# Patient Record
Sex: Male | Born: 1960 | Hispanic: Yes | Marital: Married | Smoking: Never smoker
Health system: Northeastern US, Community
[De-identification: ages and names within clinical notes are randomized; demographics above are authoritative.]

## PROBLEM LIST (undated history)

## (undated) DIAGNOSIS — H5210 Myopia, unspecified eye: Secondary | ICD-10-CM

## (undated) DIAGNOSIS — H524 Presbyopia: Secondary | ICD-10-CM

## (undated) DIAGNOSIS — T300 Burn of unspecified body region, unspecified degree: Secondary | ICD-10-CM

## (undated) DIAGNOSIS — M545 Low back pain: Principal | ICD-10-CM

## (undated) DIAGNOSIS — G8929 Other chronic pain: Principal | ICD-10-CM

## (undated) HISTORY — DX: Other chronic pain: G89.29

## (undated) HISTORY — PX: NO SIGNIFICANT SURGICAL HISTORY: 1000005

## (undated) HISTORY — DX: Low back pain: M54.5

## (undated) HISTORY — DX: Burn of unspecified body region, unspecified degree: T30.0

## (undated) HISTORY — DX: Myopia, unspecified eye: H52.10

## (undated) HISTORY — DX: Presbyopia: H52.4

---
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
## 2002-06-12 ENCOUNTER — Encounter (HOSPITAL_BASED_OUTPATIENT_CLINIC_OR_DEPARTMENT_OTHER): Payer: Self-pay

## 2003-05-15 ENCOUNTER — Encounter (HOSPITAL_BASED_OUTPATIENT_CLINIC_OR_DEPARTMENT_OTHER): Payer: Self-pay

## 2003-11-02 DIAGNOSIS — T300 Burn of unspecified body region, unspecified degree: Secondary | ICD-10-CM

## 2003-11-02 HISTORY — DX: Burn of unspecified body region, unspecified degree: T30.0

## 2004-04-01 ENCOUNTER — Ambulatory Visit (HOSPITAL_BASED_OUTPATIENT_CLINIC_OR_DEPARTMENT_OTHER): Payer: Self-pay | Admitting: Internal Medicine

## 2004-04-02 ENCOUNTER — Encounter: Payer: Self-pay | Admitting: Internal Medicine

## 2004-04-03 ENCOUNTER — Ambulatory Visit: Payer: Self-pay | Admitting: Family Medicine

## 2004-04-03 LAB — CHG LIPID PANEL
Cholesterol: 211 mg/dl — ABNORMAL HIGH (ref 0–200)
HIGH DENSITY LIPOPROTEIN: 68 mg/dl (ref 35–95)
LOW DENSITY LIPOPROTEIN DIRECT: 131 mg/dl — ABNORMAL HIGH (ref ?–130)
RISK FACTOR: 3.1 (ref ?–5.0)
TRIGLYCERIDES: 73 mg/dl (ref 30–200)

## 2004-04-03 LAB — BASIC METABOLIC PANEL
BUN (UREA NITROGEN): 20 mg/dl (ref 10–20)
CALCIUM: 9.6 mg/dl (ref 8.5–10.5)
CARBON DIOXIDE: 28 mEQ/L (ref 22–32)
CHLORIDE: 106 mEQ/L (ref 98–110)
CREATININE: 1.1 mg/dl (ref 0.8–1.4)
Glucose Random: 80 mg/dl (ref 65–160)
POTASSIUM: 5.3 mEQ/L — ABNORMAL HIGH (ref 3.5–5.0)
SODIUM: 137 mEQ/L (ref 135–145)

## 2007-11-01 ENCOUNTER — Ambulatory Visit (HOSPITAL_BASED_OUTPATIENT_CLINIC_OR_DEPARTMENT_OTHER): Payer: Self-pay

## 2007-11-21 ENCOUNTER — Encounter (HOSPITAL_BASED_OUTPATIENT_CLINIC_OR_DEPARTMENT_OTHER): Payer: Self-pay

## 2007-11-21 ENCOUNTER — Ambulatory Visit (HOSPITAL_BASED_OUTPATIENT_CLINIC_OR_DEPARTMENT_OTHER): Payer: PRIVATE HEALTH INSURANCE

## 2007-11-21 DIAGNOSIS — Z1388 Encounter for screening for disorder due to exposure to contaminants: Secondary | ICD-10-CM

## 2007-11-21 DIAGNOSIS — Z0000 Encounter for general adult medical examination without abnormal findings: Secondary | ICD-10-CM
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
## 2007-11-22 ENCOUNTER — Encounter (HOSPITAL_BASED_OUTPATIENT_CLINIC_OR_DEPARTMENT_OTHER): Payer: Self-pay

## 2007-11-22 ENCOUNTER — Ambulatory Visit (HOSPITAL_BASED_OUTPATIENT_CLINIC_OR_DEPARTMENT_OTHER): Payer: PRIVATE HEALTH INSURANCE

## 2007-11-22 DIAGNOSIS — Z0000 Encounter for general adult medical examination without abnormal findings: Secondary | ICD-10-CM

## 2007-11-22 DIAGNOSIS — Z1388 Encounter for screening for disorder due to exposure to contaminants: Secondary | ICD-10-CM

## 2007-11-22 LAB — BLOOD COUNT COMPLETE AUTO&AUTO DIFRNTL WBC
BASOPHIL %: 0.2 % (ref 0.0–2.0)
EOSINOPHIL %: 2.9 % (ref 0.0–7.0)
HEMATOCRIT: 41.9 % — ABNORMAL LOW (ref 42.0–54.0)
HEMOGLOBIN: 13.4 g/dl — ABNORMAL LOW (ref 14.0–18.0)
LYMPHOCYTE %: 39.9 % — ABNORMAL HIGH (ref 13.0–39.0)
MEAN CORP HGB CONC: 32 g/dl (ref 32.0–36.0)
MEAN CORPUSCULAR HGB: 25.9 pg — ABNORMAL LOW (ref 27.0–33.0)
MEAN CORPUSCULAR VOL: 80.9 fl (ref 80.0–100.0)
MEAN PLATELET VOLUME: 8.2 fl (ref 6.4–10.8)
MONOCYTE %: 5.6 % (ref 1.0–12.0)
NEUTROPHIL %: 51.4 % (ref 46.0–79.0)
PLATELET COUNT: 231 10*3/uL (ref 150–400)
RBC DISTRIBUTION WIDTH: 14.2 % (ref 11.5–14.3)
RED BLOOD CELL COUNT: 5.18 M/uL (ref 4.50–6.10)
WHITE BLOOD CELL COUNT: 4.8 10*3/uL (ref 4.0–10.8)

## 2007-11-22 NOTE — Progress Notes (Signed)
Drew 3 sst and 3 lavander tops.

## 2007-11-24 LAB — CHG LIPID PANEL
Cholesterol: 204 mg/dl — ABNORMAL HIGH (ref 0–200)
HIGH DENSITY LIPOPROTEIN: 62 mg/dl (ref 29–71)
LOW DENSITY LIPOPROTEIN DIRECT: 120 mg/dl — ABNORMAL HIGH (ref 0–100)
RISK FACTOR: 3.3 (ref ?–5.0)
TRIGLYCERIDES: 88 mg/dl (ref 0–150)

## 2007-11-24 LAB — COMPREHENSIVE METABOLIC PANEL
ALANINE AMINOTRANSFERASE: 25 IU/L (ref 10–40)
ALBUMIN: 4.1 g/dl (ref 3.4–4.8)
ALKALINE PHOSPHATASE: 49 IU/L (ref 25–106)
ANION GAP: 11 mmol/L (ref 2–25)
ASPARTATE AMINOTRANSFERASE: 27 IU/L (ref 8–34)
BILIRUBIN TOTAL: 0.7 mg/dl (ref 0.2–1.1)
BUN (UREA NITROGEN): 16 mg/dl (ref 6–20)
CALCIUM: 9.4 mg/dl (ref 8.6–10.0)
CARBON DIOXIDE: 27 mmol/L (ref 22–32)
CHLORIDE: 101 mmol/L (ref 101–111)
CREATININE: 1.1 mg/dl (ref 0.7–1.2)
ESTIMATED GLOMERULAR FILT RATE: >60 mL/min (ref 60–116)
Glucose Random: 88 mg/dl (ref 74–160)
POTASSIUM: 4.3 mmol/L (ref 3.5–5.1)
SODIUM: 139 mmol/L (ref 135–144)
TOTAL PROTEIN: 7.1 g/dl (ref 5.9–7.5)

## 2007-11-24 LAB — PROSTATIC SPECIFIC ANTIGEN: PROSTATIC SPECIFIC ANTIGEN: 0.6 ng/mL (ref 0.0–4.0)

## 2007-11-24 LAB — PROTOPORPHYRIN, FEP/ZPP
FREE ERYTHROCYTE PROTOPORPHYRI: 28 ug/dL (ref 0–34)
ZINC PROTOPORPHYRIN: 31 ug/dL (ref 0–38)

## 2007-11-24 LAB — LEAD VENOUS: LEAD VENOUS: 2 ug/dl (ref 0–15)

## 2007-11-24 LAB — HIV 1 AND 2 PLUS O ANTIBODY: HIV 1 AND 2 PLUS O SCREEN: NONREACTIVE

## 2007-11-28 ENCOUNTER — Encounter (HOSPITAL_BASED_OUTPATIENT_CLINIC_OR_DEPARTMENT_OTHER): Payer: Self-pay

## 2007-12-07 ENCOUNTER — Ambulatory Visit (HOSPITAL_BASED_OUTPATIENT_CLINIC_OR_DEPARTMENT_OTHER): Payer: PRIVATE HEALTH INSURANCE

## 2007-12-07 ENCOUNTER — Encounter (HOSPITAL_BASED_OUTPATIENT_CLINIC_OR_DEPARTMENT_OTHER): Payer: Self-pay

## 2007-12-07 VITALS — BP 122/68 | HR 65 | Temp 97.4°F | Ht 69.68 in | Wt 146.0 lb

## 2007-12-07 DIAGNOSIS — Z0000 Encounter for general adult medical examination without abnormal findings: Secondary | ICD-10-CM

## 2007-12-07 DIAGNOSIS — M25569 Pain in unspecified knee: Secondary | ICD-10-CM
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
## 2007-12-29 ENCOUNTER — Ambulatory Visit (HOSPITAL_BASED_OUTPATIENT_CLINIC_OR_DEPARTMENT_OTHER): Payer: PRIVATE HEALTH INSURANCE | Admitting: Orthopaedic Surgery

## 2007-12-29 DIAGNOSIS — S83419A Sprain of medial collateral ligament of unspecified knee, initial encounter: Secondary | ICD-10-CM

## 2007-12-29 LAB — XR KNEES STANDING AP ONLY BILATERAL

## 2007-12-29 LAB — KNEE, LEFT 4 VIEWS

## 2008-01-01 LAB — ORTHOPEDIC OFFICE NOTE

## 2008-01-18 ENCOUNTER — Ambulatory Visit (HOSPITAL_BASED_OUTPATIENT_CLINIC_OR_DEPARTMENT_OTHER): Payer: PRIVATE HEALTH INSURANCE | Admitting: Rehabilitative and Restorative Service Providers"

## 2008-01-18 DIAGNOSIS — S83419A Sprain of medial collateral ligament of unspecified knee, initial encounter: Secondary | ICD-10-CM

## 2008-01-23 ENCOUNTER — Ambulatory Visit (HOSPITAL_BASED_OUTPATIENT_CLINIC_OR_DEPARTMENT_OTHER): Payer: Self-pay | Admitting: Rehabilitative and Restorative Service Providers"

## 2008-01-24 ENCOUNTER — Ambulatory Visit (HOSPITAL_BASED_OUTPATIENT_CLINIC_OR_DEPARTMENT_OTHER): Payer: Self-pay

## 2008-02-23 ENCOUNTER — Ambulatory Visit (HOSPITAL_BASED_OUTPATIENT_CLINIC_OR_DEPARTMENT_OTHER): Payer: PRIVATE HEALTH INSURANCE | Admitting: Surgical

## 2009-03-17 ENCOUNTER — Encounter (HOSPITAL_BASED_OUTPATIENT_CLINIC_OR_DEPARTMENT_OTHER): Payer: Self-pay

## 2009-03-17 ENCOUNTER — Emergency Department (HOSPITAL_BASED_OUTPATIENT_CLINIC_OR_DEPARTMENT_OTHER)
Admission: RE | Admit: 2009-03-17 | Disposition: A | Discharge status: Home / Self Care | Payer: Self-pay | Source: Emergency Department | Attending: Emergency Medicine | Admitting: Emergency Medicine

## 2009-03-17 MED ORDER — MOTRIN 800 MG PO TABS
ORAL_TABLET | ORAL | Status: AC
Start: 2009-03-17 — End: 2009-04-16

## 2009-03-17 NOTE — ED Notes (Signed)
Lt groin pain, sport relateted, sudden onset after playing soccer last night, no relief from APAP & ice, difficult to ambulate per pt

## 2009-03-17 NOTE — Discharge Instructions (Signed)
You have strained a muscle in your groin.  It is important to rest, apply icepacks several times a day and take Motrin for pain.    Stay out of work for 3 days to rest and heal.

## 2009-03-21 LAB — EMERGENCY ROOM NOTE

## 2009-12-11 ENCOUNTER — Ambulatory Visit (HOSPITAL_BASED_OUTPATIENT_CLINIC_OR_DEPARTMENT_OTHER): Payer: Self-pay | Admitting: Internal Medicine

## 2010-10-20 ENCOUNTER — Ambulatory Visit (HOSPITAL_BASED_OUTPATIENT_CLINIC_OR_DEPARTMENT_OTHER): Payer: Self-pay | Admitting: Occupational Medicine

## 2010-10-22 ENCOUNTER — Ambulatory Visit (HOSPITAL_BASED_OUTPATIENT_CLINIC_OR_DEPARTMENT_OTHER): Payer: Self-pay

## 2010-10-22 LAB — TREPONEMA PALLIDUM AB IGG: TREPONEMA PALLIDUM AB IgG: NONREACTIVE

## 2011-03-01 ENCOUNTER — Emergency Department (HOSPITAL_BASED_OUTPATIENT_CLINIC_OR_DEPARTMENT_OTHER)
Admission: RE | Admit: 2011-03-01 | Disposition: A | Discharge status: Home / Self Care | Payer: Self-pay | Source: Emergency Department | Attending: Emergency Medicine | Admitting: Emergency Medicine

## 2011-03-01 ENCOUNTER — Encounter (HOSPITAL_BASED_OUTPATIENT_CLINIC_OR_DEPARTMENT_OTHER): Payer: Self-pay

## 2011-03-01 MED ORDER — IBUPROFEN 800 MG PO TABS
800.0000 mg | ORAL_TABLET | Freq: Three times a day (TID) | ORAL | Status: DC | PRN
Start: 2011-03-01 — End: 2011-04-12

## 2011-03-01 NOTE — ED Triage Note (Signed)
PT INJURED LEFT PINKY FINGER LAST WEEK PLAYING SOCCER.  PT COMPLAIN 5/10 PAIN.  +CSM.

## 2011-03-01 NOTE — Discharge Instructions (Signed)
Entorse  (Sprains)     O seu médico diagnosticou uma entorse. A entorse é uma lesão dolorida de uma junta como resultado de ruptura parcial ou completa de ligamentos.     INSTRUÇÕES PARA TRATAMENTO DOMICILIAR  Ø Nas primeiras 24 horas, mantenha a extremidade machucada elevada sobre 2 travesseiros enquanto estiver deitado.  Ø Aplique compressa de gelo (gelo num saco plástico enrolado numa toalha para evitar ulceração da pele) sobre a área machucada a cada 2 horas por 20 a 30 minutos, enquanto estiver acordado pelas primeiras 24 horas e, a seguir, conforme orientado pelo médico.  Ø Só tome no balcao ou medicinas de receita para dor, incômodo, ou febre como dirigido por seu médico.  Ø Se lhe foi aplicada hoje uma bandagem de pressão, ela deve ser removida (afrouxada) e aplicada novamente a cada 3 ou 4 horas. Não deve ser mantida muito apertada, mas suficientemente firme para diminuir o inchaço. Observe se os dedos das mãos e dos pés se estão inchados, com descoloração azulada, frios, com formigamento ou dor excessiva. Se qualquer desses sintomas ocorrer, remova a bandagem e reaplique frouxamente. Se estes sintomas persistirem, entre em contato com seu médico ou retorne a este local.     Dor persistente e incapacidade de usar a área machucada por mais de 2 ou 3 dias são sinais de advertência que indicam que um médico deve ser procurado tão logo possível para o prosseguimento do tratamento. No princípio, uma fratura da espessura de um fio de cabelo (é o mesmo que um osso quebrado) pode não ficar aparente nos raios X. Dor persistente e inchaço indicam a necessidade de uma avaliação mais profunda, não se submeter a nenhum tipo de peso (uso de muletas, conforme indicação) e/ou mais raios X são indicados. Os raios X, às vezes, podem não mostrar uma fratura pequena até uma semana ou dez dias mais tarde. Marque uma consulta para acompanhamento com o seu próprio médico ou com um daqueles a quem nós indicamos. Um radiologista  reexaminará as seus raios X. Certifique-se de como obter os resultados de seus raios X. Não presuma que tudo está normal se você não entrarmos em contato.     PROCURE UM MÉDICO IMEDIATAMENTE SE:  Ø Apresentar forte dor ou mais inchaço.  Ø A dor não for controlada com medicamentos.  Ø A pele ou unha sob a lesão ficar roxa, acinzentada, gelada ou dormente.     Document Released: 10/18/2005  Document Re-Released: 08/20/2008  Exit[employer]® Patient Information ©2010 [employer].

## 2011-03-01 NOTE — ED Notes (Signed)
FINGER SPLINT APPLIED BY PAR II.  DISCHARGE INSTRUCTIONS PROVIDED TO PT.  PT VERBALIZED UNDERSTANDING WITHOUT QUESTION.  PT AMBULATED OUT OF ED.
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
## 2011-03-02 NOTE — ED Provider Notes (Signed)
Attempted to call patient re: xray results, no answer, left message.

## 2011-03-03 NOTE — ED Provider Notes (Signed)
Spoke with pt  & informed of XR findings suggestive of possible tendon injury, encouraged to wear splint & call orthopedics for f/u appt.

## 2011-03-05 ENCOUNTER — Ambulatory Visit (HOSPITAL_BASED_OUTPATIENT_CLINIC_OR_DEPARTMENT_OTHER): Payer: No Typology Code available for payment source

## 2011-03-05 DIAGNOSIS — M20019 Mallet finger of unspecified finger(s): Secondary | ICD-10-CM

## 2011-03-05 NOTE — Progress Notes (Signed)
This office note has been dictated. Account number [id number]

## 2011-03-10 NOTE — Progress Notes (Signed)
Date of Service: 03/05/2011    DATE OF SERVICE:  Mar 05, 2011    DIAGNOSIS:  Left small finger mallet finger.    HISTORY OF PRESENT ILLNESS:  This 50 year old right-handed male patient explains he was playing soccer 9 days ago and the ball hit his left finger.  He jammed the finger and immediately developed pain and flexion deformity.  He was seen and evaluated in the Emergency Department for this injury.  X-rays were performed.  These were negative for any fracture.  They placed him in a volar finger splint and asked him to follow up with orthopedics.  He had only minimal complaints of pain.  No numbness or tingling and no skin issues.    PAST MEDICAL HISTORY:  Negative for chronic illnesses.    MEDICATIONS:  Ibuprofen 800 mg.    ALLERGIES:  No known drug allergies.    SOCIAL HISTORY:  The patient is a nonsmoker, denies alcohol usage.  His primary language is [place], but he does speak [place].  An interpreter is not required.  He works in Holiday representative.    REVIEW OF SYSTEMS:  Significant for a left finger injury with pain associated at the DIP joint and a flexion deformity.  All other systems reviewed and found to negative for symptoms.    FAMILY HISTORY:  Noncontributory.    PAST SURGICAL HISTORY:  Negative.    PHYSICAL EXAMINATION:  GENERAL:  Reveals a very pleasant 50 year old male patient in no acute distress.  MUSCULOSKELETAL:  The left upper extremity reveals the small finger to have a mallet finger deformity.  He has about 45-degree extension lag.  I am able to passively extend the finger to 0 station.  The skin is intact without lacerations, abrasions or lesions.  Gross sensation is intact to the fingertip.  The other digits are in individually examined and found to be negative for any injury and 2+ radial pulses noted.    DIAGNOSTIC DATA:  X-rays of the left small finger were reviewed again today.  These reveal no evidence of bony association with the mallet deformity.    ASSESSMENT AND PLAN:  A  50 year old male patient with a small finger mallet finger on the left.  His diagnosis was explained to him today.  I spent time counseling and educating him on how to treat this.  It very important that he splints full time.  We will see him back in 2 weeks to check his skin and make sure he is splinting appropriately.  Two splints were given today and instructions were given on how to change these.  He will require assistance.  He will see [place] back for followup in 2 weeks.    ___________________________  Reviewed and Electronically Signed By: Juanetta Gosling PA-C  Sig Date: 03/10/2011  Sig Time: 09:32:20  Dictated By: Juanetta Gosling PA-C  Dict Date: 03/05/2011 Dict Time: 12 07 PM    Dictation Date and Time:03/05/2011 12:07:37  Transcription Date and Time:03/05/2011 12:47:12  eScription Dictation id: [id number] Confirmation # :[id number]    DICTATED BY: Juanetta Gosling PA-CKATRINA D THOMPSON PA-CD:03/05/2011 12:07:37 T:03/05/2011 12:47:12 79F Job#: [id number]

## 2011-03-11 ENCOUNTER — Telehealth (HOSPITAL_BASED_OUTPATIENT_CLINIC_OR_DEPARTMENT_OTHER): Payer: Self-pay | Admitting: Internal Medicine

## 2011-03-11 NOTE — Telephone Encounter (Signed)
Left message for patient to call [facility] and book an appointment with Dr. Greer Ee as a New PT.

## 2011-03-17 NOTE — Telephone Encounter (Addendum)
Called patient again. Patient booked an appointment with Dr. Noel Gerold on 04/12/11

## 2011-03-18 ENCOUNTER — Ambulatory Visit (HOSPITAL_BASED_OUTPATIENT_CLINIC_OR_DEPARTMENT_OTHER): Payer: No Typology Code available for payment source | Admitting: Hand Surgery

## 2011-03-22 ENCOUNTER — Ambulatory Visit (HOSPITAL_BASED_OUTPATIENT_CLINIC_OR_DEPARTMENT_OTHER): Payer: No Typology Code available for payment source | Admitting: Hand Surgery

## 2011-03-22 DIAGNOSIS — M20019 Mallet finger of unspecified finger(s): Secondary | ICD-10-CM

## 2011-03-22 NOTE — Progress Notes (Signed)
This office note has been dictated. Account number [id number]

## 2011-04-12 ENCOUNTER — Ambulatory Visit (HOSPITAL_BASED_OUTPATIENT_CLINIC_OR_DEPARTMENT_OTHER): Payer: No Typology Code available for payment source | Admitting: Internal Medicine

## 2011-04-12 ENCOUNTER — Encounter (HOSPITAL_BASED_OUTPATIENT_CLINIC_OR_DEPARTMENT_OTHER): Payer: Self-pay | Admitting: Internal Medicine

## 2011-04-12 VITALS — BP 128/70 | HR 66 | Temp 98.1°F | Ht 69.69 in | Wt 147.0 lb

## 2011-04-12 DIAGNOSIS — Z833 Family history of diabetes mellitus: Secondary | ICD-10-CM

## 2011-04-12 DIAGNOSIS — L80 Vitiligo: Secondary | ICD-10-CM | POA: Insufficient documentation

## 2011-04-12 DIAGNOSIS — R1030 Lower abdominal pain, unspecified: Secondary | ICD-10-CM

## 2011-04-12 DIAGNOSIS — Z0000 Encounter for general adult medical examination without abnormal findings: Secondary | ICD-10-CM

## 2011-04-12 DIAGNOSIS — R011 Cardiac murmur, unspecified: Secondary | ICD-10-CM

## 2011-04-12 MED ORDER — NAPROXEN 500 MG PO TABS
500.0000 mg | ORAL_TABLET | Freq: Two times a day (BID) | ORAL | Status: DC
Start: 2011-04-12 — End: 2013-08-21
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
## 2011-04-16 LAB — SCHISTOSOMIASIS AB: SCHISTOSOMIASIS ANTIBODY: 0.07 OD (ref 0.00–0.19)

## 2011-04-16 LAB — STRONGYLOIDES ANTIBODY IGG: STRONGYLOIDES ANTIBODY IGG: 0.21 IV (ref <=1.49)

## 2011-04-22 ENCOUNTER — Ambulatory Visit (HOSPITAL_BASED_OUTPATIENT_CLINIC_OR_DEPARTMENT_OTHER): Payer: No Typology Code available for payment source | Admitting: Hand Surgery

## 2011-04-22 DIAGNOSIS — M20019 Mallet finger of unspecified finger(s): Secondary | ICD-10-CM

## 2011-04-22 NOTE — Progress Notes (Signed)
This office note has been dictated. Account number [id number]

## 2011-05-25 ENCOUNTER — Ambulatory Visit (HOSPITAL_BASED_OUTPATIENT_CLINIC_OR_DEPARTMENT_OTHER): Payer: No Typology Code available for payment source | Admitting: Lab

## 2011-05-25 DIAGNOSIS — Z0000 Encounter for general adult medical examination without abnormal findings: Secondary | ICD-10-CM

## 2011-05-25 LAB — FECAL OCCULT (POINT OF CARE) HOME TEST 3 CARDS
LOT #: 1411
OCCULT BLOOD: NEGATIVE
OCCULT BLOOD: NEGATIVE
OCCULT BLOOD: NEGATIVE

## 2011-05-25 NOTE — Progress Notes (Signed)
Fecal occult blood test result negative x 3   sc

## 2011-06-02 NOTE — Progress Notes (Signed)
Date of Service: 03/22/2011    DIAGNOSIS:  Mallet finger, date Mar 22, 2011.      This is a 50 year old right-hand dominant gentleman who was playing soccer at the end of April and sustained a mallet finger of his left small finger.  He has been treated with fulltime splinting.  He has been in a splint now for 2-1/2 weeks.      Today, I removed the splint and was careful to keep his finger in full extension, and he has clearly been faithfully wearing the splint as the skin on the top of his finger is flattened.      I treated him with rubbing him with a little bit of alcohol on the top of the finger and replaced the splint.  I told him that he needed to keep this splint on full time for another 6 weeks, and will see him back in 6 weeks to remove it.  At that point he will go night wear only for about a month.    ___________________________  Reviewed and Electronically Signed By: Resa Miner MD  Sig Date: 06/02/2011  Sig Time: 10:48:43  Dictated By: Resa Miner MD  Dict Date: 04/01/2011 Dict Time: 02 58 PM    Dictation Date and Time:04/01/2011 14:58:11  Transcription Date and Time:04/01/2011 15:21:13  eScription Dictation id: [id number] Confirmation # :[id number]

## 2011-07-07 ENCOUNTER — Encounter (HOSPITAL_BASED_OUTPATIENT_CLINIC_OR_DEPARTMENT_OTHER): Payer: Self-pay | Admitting: Internal Medicine

## 2011-07-07 DIAGNOSIS — D649 Anemia, unspecified: Secondary | ICD-10-CM | POA: Insufficient documentation

## 2011-07-20 NOTE — Progress Notes (Signed)
Date of Service: 04/22/2011    DIAGNOSIS:  Mallet finger of the left small finger.    HISTORY OF PRESENT ILLNESS:  He has been splinting since early May, so he has been splinting for about 8 weeks.  He removed his splint today.  His skin is flat.  There is evidence that he has been wearing his splint all the time.  His finger is completely straight.  He has no lag whatsoever.    ASSESSMENT AND PLAN:  I told him now that I would like him to continue to use the splint just at night for the next 1 month and then he can discontinue it.  I will see him back now on a p.r.n. basis.  He has an excellent result.    ___________________________  Reviewed and Electronically Signed By: Resa Miner MD  Sig Date: 07/20/2011  Sig Time: 20:51:25  Dictated By: Resa Miner MD  Dict Date: 04/22/2011 Dict Time: 05 37 PM    Dictation Date and Time:04/22/2011 17:37:55  Transcription Date and Time:04/22/2011 17:59:32  eScription Dictation id: [id number] Confirmation # :[id number]

## 2011-08-22 ENCOUNTER — Emergency Department (HOSPITAL_BASED_OUTPATIENT_CLINIC_OR_DEPARTMENT_OTHER)
Admission: RE | Admit: 2011-08-22 | Disposition: A | Discharge status: Home / Self Care | Payer: Self-pay | Source: Emergency Department | Attending: Internal Medicine | Admitting: Internal Medicine

## 2011-08-22 ENCOUNTER — Encounter (HOSPITAL_BASED_OUTPATIENT_CLINIC_OR_DEPARTMENT_OTHER): Payer: Self-pay

## 2011-08-22 MED ORDER — IBUPROFEN 600 MG PO TABS
600.00 mg | ORAL_TABLET | Freq: Once | ORAL | Status: AC
Start: 2011-08-22 — End: 2011-08-22
  Administered 2011-08-22: 600 mg via ORAL
  Filled 2011-08-22: qty 1

## 2011-08-22 MED ORDER — IBUPROFEN 600 MG PO TABS
600.0000 mg | ORAL_TABLET | Freq: Three times a day (TID) | ORAL | Status: DC | PRN
Start: 2011-08-22 — End: 2011-08-27
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
## 2011-08-22 NOTE — Discharge Instructions (Signed)
Luxao do ombro  (Luxao acrmio-clavicular)  (Shoulder Separation (Acromio-Clavicular Separation))  Um de seus ombros foi deslocado (luxao acrmio-clavicular). Isso ocorre quando uma leso estira ou rompe os ligamentos que conectam a parte superior Arts development officer. A leso faz com que esses ossos se separem. Uma tala ou tipia pode ser usada para limitar os movimentos.     CUIDADOS EM CASA   Aplique gelo na leso por 15 a 20 minutos a cada hora, nos primeiros dois dias. Coloque o gelo em um saco plstico. Coloque uma toalha entre o saco plstico e sua pele.    Evite atividades que aumentem a dor.    Use a tipia ou tala por uma semana ou conforme solicitado pelo seu mdico. Se remover a tipia para se vestir ou tomar banho, certifique-se de que o brao permanea na mesma posio. No levante o brao.    A tala deve ser [place] todos os dias por uma outra pessoa. Aperte o bastante para manter os ombros no lugar. Deixe espao suficiente para colocar o dedo indicador entre a faixa e o corpo. Solte imediatamente a faixa se sentir dormncia ou formigamento nas mos.    S tome no balcao ou medicinas de receita para dor, incmodo, ou febre como dirigido por seu mdico.    Se for uma leso grave, pode ser necessrio consultar um especialista.  muito importante comparecer a todas as consultas de acompanhamento para evitar qualquer tipo de leso permanente no ombro ou dores crnicas.   PROCURE ASSISTNCIA MDICA SE:  Engineer, civil (consulting) ou do inchao e no houver alvio com medicamentos.  PROCURE ASSISTNCIA MDICA IMEDIATAMENTE SE:  Seu brao ficar dormente, plido, frio ou voc sentir dores anormais nas pontas dos dedos.  Document Released: 04/11/2006 Document Re-Released: 01/14/2009  [facility] Patient Information 2012 [place], [place].

## 2011-08-22 NOTE — ED Notes (Signed)
DR. Venetia Maxon, Lindie Spruce, PA BEDSIDE

## 2011-08-22 NOTE — ED Triage Note (Signed)
PT AMBULATED TO ED #4 W/ REPORT OF LEFT SHOULDER PAIN AFTER FALLING WHILE PLAYING SOCCER THIS A.M., PT'S LEFT SHOULDER APPEARS TO BE DEVIATED FORWARD, BUT DIFFICULT TO ASCERTAIN AS PT IS THIN - SITTING ON STRETCHER, SMILING, MEGHAN, PA BEDSIDE W/ PT

## 2011-08-22 NOTE — ED Notes (Signed)
REVIEWED/DISCUSSED DX, FOLLOW UP W/ ORTHO X 1 WEEK, SLING USE, S/S TO REPORT W/ PT W/ QUESTIONS ASKED AND ANSWERED - PT AMBULATED OUT OF ED - RETURN PRN

## 2011-08-22 NOTE — ED Notes (Signed)
MEGHAN, PA BEDSIDE W/ PT

## 2011-08-25 ENCOUNTER — Encounter (HOSPITAL_BASED_OUTPATIENT_CLINIC_OR_DEPARTMENT_OTHER): Payer: Self-pay | Admitting: Internal Medicine

## 2011-08-25 DIAGNOSIS — M25512 Pain in left shoulder: Secondary | ICD-10-CM | POA: Insufficient documentation

## 2011-08-27 ENCOUNTER — Ambulatory Visit (HOSPITAL_BASED_OUTPATIENT_CLINIC_OR_DEPARTMENT_OTHER): Payer: No Typology Code available for payment source | Admitting: Orthopaedic Surgery

## 2011-08-27 DIAGNOSIS — S43109A Unspecified dislocation of unspecified acromioclavicular joint, initial encounter: Secondary | ICD-10-CM

## 2011-08-27 DIAGNOSIS — M25512 Pain in left shoulder: Secondary | ICD-10-CM

## 2011-08-27 MED ORDER — IBUPROFEN 600 MG PO TABS
600.0000 mg | ORAL_TABLET | Freq: Three times a day (TID) | ORAL | Status: AC | PRN
Start: 2011-08-27 — End: 2011-09-26

## 2011-08-27 NOTE — Progress Notes (Signed)
This office note has been dictated. Account number [id number]

## 2011-09-02 ENCOUNTER — Encounter (HOSPITAL_BASED_OUTPATIENT_CLINIC_OR_DEPARTMENT_OTHER): Payer: Self-pay | Admitting: Internal Medicine

## 2011-09-02 NOTE — Progress Notes (Signed)
Date of Service: 08/27/2011    DIAGNOSIS:  Left shoulder AC joint separation.    HISTORY OF PRESENT ILLNESS:  The patient is a 50 year old right-hand dominant [place] speaking male who comes on followup from the [facility] Emergency Department where he was seen on August 22, 2011 with complaints of left shoulder pain.  The patient states he fell striking his left shoulder while playing soccer that same day.  The patient denies prior problem with the shoulder. He denies pain at rest. Pain with shoulder elevation is 3/10. He denies numbness or paresthesias in his left upper extremity. He also complains of improving left chest pain for the past several days. This pain comes on with cough or deep inhalation.    PAST MEDICAL HISTORY:  Systolic murmur, myopia, presbyopia, vitiligo.    PAST SURGICAL HISTORY:  The patient denies.    ALLERGIES:  No known drug allergies.    MEDICATIONS:  Ibuprofen 600 mg.    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  His boss has put him on light duty without issue.  The patient denies tobacco, alcohol, or illicit drug use.  He lives with his wife.    REVIEW OF SYSTEMS:  Negative except for pertinent positives noted in the HPI.    PHYSICAL EXAMINATION:  The patient is alert, oriented, in no acute distress.  Inspection of the shoulders reveals the skin is intact with no warmth, erythema or ecchymosis.  There is a mild deformity of the left AC joint without tenting of the skin.  There is moderate palpable tenderness of the left AC joint. The shoulder is otherwise nontender to palpation.  Active shoulder elevation and abduction is 80 degrees, limited by pain.  He has full external rotation with arms at the side. There is no palpable tenderness of the left chest wall. He is neurovascularly intact distally.    RADIOGRAPHS:  X-rays of the left shoulder reviewed by myself and Dr. Cheron Every reveals grade 2 AC joint separation.  There  were no other abnormalities noted. No rib fractures.    ASSESSMENT AND PLAN:  This is a 50 year old male with grade 2 left shoulder joint separation.  He is given a referral to begin physical therapy in 3 weeks.  He is also to see Dr Cheron Every in 3 weeks prior to beginning PT.  He can use a sling as needed.  He does not want to take any medication at this time.  The patient seen and examined by Edwyna Ready, MD, and he agrees with this plan.    ___________________________  Reviewed and Electronically Signed By: Edwyna Ready MD  Sig Date: 09/02/2011  Sig Time: 12:19:00  Dictated By: Rondall Allegra PA-C  Dict Date: 08/27/2011 Dict Time: 01 12 PM    Dictation Date and Time:08/27/2011 13:12:18  Transcription Date and Time:08/27/2011 16:56:43  eScription Dictation id: [id number] Confirmation # :[id number]

## 2011-09-17 ENCOUNTER — Ambulatory Visit (HOSPITAL_BASED_OUTPATIENT_CLINIC_OR_DEPARTMENT_OTHER): Payer: No Typology Code available for payment source | Admitting: Orthopaedic Surgery

## 2011-09-17 DIAGNOSIS — S43109A Unspecified dislocation of unspecified acromioclavicular joint, initial encounter: Secondary | ICD-10-CM

## 2011-09-20 NOTE — Progress Notes (Signed)
The primary progress note for this visit has been dictated through E-Scription. It can be viewed as an attachment to this encounter or through Chart Review under the Other Tab as an Orthopedic Office Note.      Review of Systems: Constitutional, Eyes, ENT/Mouth, Cardiovascular, Respiratory, GI, GU, Neuro, Psych, Heme/Lymph, Skin, Musculoskeletal was reviewed and is NEGATIVE except for what is dictated in the note.    MT ACCT #:  [id number]

## 2011-09-21 ENCOUNTER — Encounter (HOSPITAL_BASED_OUTPATIENT_CLINIC_OR_DEPARTMENT_OTHER): Payer: Self-pay

## 2011-09-21 ENCOUNTER — Emergency Department (HOSPITAL_BASED_OUTPATIENT_CLINIC_OR_DEPARTMENT_OTHER)
Admission: RE | Admit: 2011-09-21 | Discharge status: Against Medical Advice | Payer: Self-pay | Source: Emergency Department | Attending: Emergency Medicine | Admitting: Emergency Medicine

## 2011-09-21 LAB — CBC, PLATELET & DIFFERENTIAL
ABSOLUTE BASO COUNT: 0 10*3/uL (ref 0.0–0.1)
ABSOLUTE EOSINOPHIL COUNT: 0.1 10*3/uL (ref 0.0–0.8)
ABSOLUTE IMM GRAN COUNT: 0.01 10*3/uL (ref 0.00–0.03)
ABSOLUTE LYMPH COUNT: 1.5 10*3/uL (ref 0.6–5.9)
ABSOLUTE MONO COUNT: 0.4 10*3/uL (ref 0.2–1.4)
ABSOLUTE NEUTROPHIL COUNT: 4.1 10*3/uL (ref 1.6–8.3)
BASOPHIL %: 0.5 % (ref 0.0–1.2)
EOSINOPHIL %: 2.1 % (ref 0.0–7.0)
HEMATOCRIT: 41.3 % (ref 40.1–51.0)
HEMOGLOBIN: 13.3 g/dL — ABNORMAL LOW (ref 13.7–17.5)
IMMATURE GRANULOCYTE %: 0.2 % (ref 0.0–0.4)
LYMPHOCYTE %: 24.4 % (ref 15.0–54.0)
MEAN CORP HGB CONC: 32.2 g/dL (ref 31.0–37.0)
MEAN CORPUSCULAR HGB: 25.7 pg — ABNORMAL LOW (ref 26.0–34.0)
MEAN CORPUSCULAR VOL: 79.7 fL — ABNORMAL LOW (ref 80.0–100.0)
MEAN PLATELET VOLUME: 9.4 fL (ref 8.7–12.5)
MONOCYTE %: 6.9 % (ref 4.0–13.0)
NEUTROPHIL %: 65.9 % (ref 40.0–75.0)
PLATELET COUNT: 171 10*3/uL (ref 150–400)
RBC DISTRIBUTION WIDTH STD DEV: 41.5 fL (ref 35.1–46.3)
RBC DISTRIBUTION WIDTH: 14.4 % — ABNORMAL HIGH (ref 11.5–14.3)
RED BLOOD CELL COUNT: 5.18 M/uL (ref 4.60–6.10)
WHITE BLOOD CELL COUNT: 6.3 10*3/uL (ref 4.0–11.0)

## 2011-09-21 LAB — COMPREHENSIVE METABOLIC PANEL
ALANINE AMINOTRANSFERASE: 20 IU/L (ref 10–40)
ALBUMIN: 4.4 g/dl (ref 3.4–4.8)
ALKALINE PHOSPHATASE: 48 IU/L (ref 25–106)
ANION GAP: 8 mmol/L (ref 3–11)
ASPARTATE AMINOTRANSFERASE: 25 IU/L (ref 8–34)
BILIRUBIN TOTAL: 0.7 mg/dl (ref 0.2–1.1)
BUN (UREA NITROGEN): 21 mg/dl — ABNORMAL HIGH (ref 6–20)
CALCIUM: 9.5 mg/dl (ref 8.6–10.3)
CARBON DIOXIDE: 28 mmol/L (ref 22–32)
CHLORIDE: 101 mmol/L (ref 101–111)
CREATININE: 1.1 mg/dl (ref 0.7–1.2)
ESTIMATED GLOMERULAR FILT RATE: >60 mL/min (ref >60)
Glucose Random: 90 mg/dl (ref 74–160)
POTASSIUM: 4.4 mmol/L (ref 3.5–5.1)
SODIUM: 137 mmol/L (ref 135–144)
TOTAL PROTEIN: 7.3 g/dl (ref 5.9–7.5)

## 2011-09-21 LAB — HOLD BLUE TOP TUBE

## 2011-09-21 LAB — LIPASE: LIPASE: 25 U/L (ref 10–50)

## 2011-09-21 LAB — TROPONIN I: TROPONIN I: 0.02 ng/mL (ref 0.00–0.04)

## 2011-09-21 MED ORDER — OMEPRAZOLE 20 MG PO CPDR
40.00 mg | DELAYED_RELEASE_CAPSULE | Freq: Every day | ORAL | Status: AC
Start: 2011-09-21 — End: 2011-10-05

## 2011-09-21 MED ORDER — FAMOTIDINE 10 MG/ML IV SOLN
20.00 mg | Freq: Once | INTRAVENOUS | Status: AC
Start: 2011-09-21 — End: 2011-09-21
  Administered 2011-09-21: 20 mg via INTRAVENOUS
  Filled 2011-09-21: qty 2

## 2011-09-21 MED ORDER — ALUMINUM-MAGNESIUM-SIMETHICONE 200-200-20 MG/5ML PO SUSP
30.00 mL | Freq: Once | ORAL | Status: AC
Start: 2011-09-21 — End: 2011-09-21
  Administered 2011-09-21: 30 mL via ORAL
  Filled 2011-09-21: qty 30

## 2011-09-21 MED ORDER — LIDOCAINE VISCOUS 2 % MT SOLN
5.00 mL | Freq: Once | OROMUCOSAL | Status: AC
Start: 2011-09-21 — End: 2011-09-21
  Administered 2011-09-21: 5 mL via OROMUCOSAL
  Filled 2011-09-21: qty 15

## 2011-09-21 NOTE — ED Notes (Signed)
Pt feeling better since medication

## 2011-09-21 NOTE — ED Notes (Signed)
Iv placed. Labs sent. Pt remains with epigastic pain which he calls a squeezing pain. Pt denies nausea, or diaphoresis.

## 2011-09-21 NOTE — Discharge Instructions (Signed)
You were evaluated in the emergency department for abdominal pain.  Your pain is likely due to chronic use of the ibuprofen and Naprosyn.  You should avoid any medications that are called NSAIDs which include ibuprofen, Advil, Motrin and Naprosyn.  This group of medications can further contribute to worsening pain and can cause ulcers in your stomach.  You were given a medication to reduce the acid production in your stomach.  Please take this medication as prescribed for the entire duration of the prescription.    Please call your Primary Care Provider with a progress report regarding your Emergency Department visit. He/she may want to see you for a follow-up appointment.     If you do not have a Primary Care Provider, please call [phone] to establish one through the [facility] system. You may also go on the web site at [email or web address]    Return to the ED at any time with fever >102.5 degrees F, chest pain, shortness of breath or difficulty breathing, numbness/tingling/weakness, bowel or bladder incontinence, or any other symptoms concerning to you.

## 2011-09-21 NOTE — ED Notes (Signed)
Patient Disposition    Patient education for diagnosis, medications, activity, diet and follow-up.  Patient left ED 8:40 PM.  Patient rep received written instructions.  Interpreter to provide instructions: No    Discharged to: Discharged to home
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
## 2011-09-21 NOTE — ED Provider Notes (Signed)
ADDENDUM: Briefly, this is a 50 y/o male who presents with complaints of epigastric abdominal pain. The patient was seen by myself in coordination with PA Jenna Comeau. I certify that I independently saw and examined this patient and made all diagnostic and treatment decisions. For initial history, physical exam, and ED course, please see PA Comeau's note.     The patient states that his pain started 4 days ago. It is worse with eating. He has been taking both motrin and aleve daily for a left shoulder injury. He denies any black or blood stools. The patient is very active. Here is he noted to be bradycardic here but is asymptomatic. I suspect this is his normal HR as he is very active.    EKG: bradycardia with a sinus arrhythmia. Ventricular rate is 43. Normal axis, QRS is 104 ms. No ST elevation, ST depression, or TWI. No prior for comparison.    In the ER the patient was medicated with an oral GI cocktail and pepcid IV. CBC, CMP, lipase, and troponin were normal. The patient's pain was relieved with medications. I believe his symptoms are 2/2 gastritis from NSAID use. He is to STOP the NSAIDS and will be prescribed prilosec. Reasons to return to the ED were reviewed with the patient.

## 2011-09-21 NOTE — ED Triage Note (Signed)
Pt having left mid abd pain x 4 days.  Increases when tries to eat.  Normal bowels today with no report of blood.  Normal urination noted.  Hr low in triage.

## 2011-09-22 ENCOUNTER — Encounter (HOSPITAL_BASED_OUTPATIENT_CLINIC_OR_DEPARTMENT_OTHER): Payer: Self-pay | Admitting: Internal Medicine

## 2011-09-22 DIAGNOSIS — R109 Unspecified abdominal pain: Secondary | ICD-10-CM | POA: Insufficient documentation

## 2011-09-23 ENCOUNTER — Ambulatory Visit (HOSPITAL_BASED_OUTPATIENT_CLINIC_OR_DEPARTMENT_OTHER): Payer: No Typology Code available for payment source | Admitting: Rehabilitative and Restorative Service Providers"

## 2011-10-05 ENCOUNTER — Ambulatory Visit (HOSPITAL_BASED_OUTPATIENT_CLINIC_OR_DEPARTMENT_OTHER): Payer: No Typology Code available for payment source | Admitting: Rehabilitative and Restorative Service Providers"

## 2011-10-05 DIAGNOSIS — M25512 Pain in left shoulder: Secondary | ICD-10-CM

## 2011-10-05 NOTE — Progress Notes (Signed)
Physical Therapy Outpatient Evaluation: Shoulder  Referring Provider: Clydie Braun A. Brown Human, PA-C  [address]  [place], [place] [address]  Diagnosis: L shoulder pain (719.41)   Language of Care: English (Primary is [place] - pt denied use of interpreter)   Requires an interpreter: No    Subjective:  History of Present Illness: Pt is a 50 year old male who is reporting today with L shoulder pain s/p sustaining a fall while playing soccer. XRay (-) for fracture, (+) for grade II AC joint separation. MD recommended conservative management at this time. The patient reports that he experiences most of his pain during extremes of ROM overhead and behind his back. At rest, he is in 0/10 pain. He reports the pain as about 2-3/10 during these activities. Pt denies any numbness or tingling. He does report an occasional pain on the left side of his neck that goes up to the back of his head.  He reports greatest difficult with reaching, lifting heavy objects, donning and tucking in his shirt. 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 165 4+ 180 5   EXT.  4+  5   IR L3 4 T8 5   ER T1 4+ T1 5   ABD 150 4+ 180 5   H. ABD       H. ADD       Low Trap  4-  4+   Mid Trap  4-  4+   ELBOW       FLEX.       EXT.       WRIST        FLEX.       EXT.       SUP/PRON         JOINT MOBILITY  Gleno-Humeral RIGHT LEFT   Posterior 3/6 2/6   Inferior     Anterior     *Apprehension Test     AC Joint 3/6 2/6   TSpine     PA 2/6 2/6   CSpine     PA       CERVICAL SPINE ROM  Cervical Spine ROM Degrees and End-feel   Flexion WNL throughout C Spine   Extension    SB Right    SB Left    Rotation Right    Rotation Left      SPECIAL TESTS   IMPINGEMENT L R CERVICAL SPINE L R ROTATOR CUFF L R   Hawkins impingement -  C-Spine Distraction   Empty can +    Neer's  +  Spurlings Test   Drop-Arm -  Resisted ER -  Sharp-Purser   Resisted ER -    Painful Arc -                     AC JOINT   LABRUM        Active Compression +  Biceps Load -         Assessment    Referring Provider: Eugenio Hoes. Brown Human, PA-C  [address]  [place], [place] [address]  Diagnosis: L shoulder pain    Assessment: Pt is a 50 year old male who is reporting today for evaluation of his L shoulder pain s/p fall and subsequent diagnosis of L AC joint separation. He presents with L shoulder strength and ROM deficits, limited L GH and AC joint mobility, limited T Spine mobility, and poor posture. He is limited in his ability to tolerate activities that involve extremes of ROM including dressing and reaching overhead. He also has limitations with lifting heavy objects, thus having poor tolerance off full-duty work-related activity. He will benefit from PT in order to improve the above listed deficits and return him to his PLOF.     Pain, Decreased ROM, Decreased Strength, Decreased Functional Mobility and Decreased Joint Mobility  Patient will benefit from skilled PT to address the rehabilitation goals outlined below.    Rehabilitation Goals    Short-term Goals  Pt will be indep with his HEP in 2 weeks  Pt will demonstrate normalized GH, AC and TSpine joint mobility in 3 weeks  Pt will normalize L shoulder ROM in all planes in 4 weeks to assist with tolerance of ADLs  Pt will improve L shoulder  strength by 1/2 MMT grade in all directions in 4 weeks  Pt will improve B periscapular strength by 1/2 MMT grade in 4 weeks.  Pt will improve postural awareness from fair to good without cuing in 4 weeks    Long-term Goals  Pt will have no difficulty with liftng >50# in 8 weeks to improve tolerance of work-related activity    Treatment Plan:   ** Stretching/ROM Exercise  ** Therapeutic Exercise  ** Home Exercise Program  ** Electrical Stimulation (if necessary)  ** Joint Mobilization  ** Soft Tissue Mobilization  ** Hot/Cold Rx  ** Functional Activities  ** Patient Education    Recommend Physical Therapy be continued 2 times per week for 8 weeks.  The rehabilitation potential for this patient is good    Patient is agreeable to treatment plan and is aware of attendance policy.    Mitchel Honour, PT, DPT

## 2011-10-05 NOTE — Patient Instructions (Addendum)
Rehabilitation Treatment Flowsheet    Precautions:    Date: 10/05/11         Initials:          Visit #:          POC Due Date:          Time:          HEP horiz abd, D2 flexion, flexion, extension, ER, prone Ts, LS stretch         Treatment(s)                General IE Completed Serratus strength, AC joint mobility              Pt ed Discussed prognosis, POC, HEP             Prone T's    2x10 L UE        1/2 Roller        horiz abd    BER    L D2 flexion              Manual  GH, AC, TSpine mobs    MWM scaption and IR

## 2011-10-06 DIAGNOSIS — M25512 Pain in left shoulder: Secondary | ICD-10-CM | POA: Insufficient documentation

## 2011-10-08 NOTE — Progress Notes (Signed)
Date of Service: 09/17/2011    DIAGNOSIS:  Left shoulder acromioclavicular joint separation.    HISTORY OF PRESENT ILLNESS:  Mr. Olmeda is a 50 year old [place] English speaking male.  He had sustained an AC shoulder grade 2 separation on October 21 and was seen by the orthopedic department for complaints of left shoulder pain.  Since that time, he has been able to discontinue the use of any narcotics.  He has also discontinued NSAIDs due to some stomach upset.  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  He does note some weakness in his right shoulder.  He does get some mild trapezius type discomforts, but overall is functioning well and comfortable.  He is no longer using his sling.    PHYSICAL EXAMINATION:  GENERAL:  Mr. Mcclune is alert and oriented.  MUSCULOSKELETAL:  To inspection of his bilateral shoulder, there is an obvious prominence on the left.  He does have good range of motion.  In the left shoulder, he is lacking the last few degrees of abduction.  Internal and external rotation is present.  Strength of the rotator cuff muscle is present, although weakness is noted on the left side, and no neurovascular deficit is noted in the upper extremity.      ASSESSMENT AND PLAN:  Mr. Seibert returns to the orthopedic office today for followup of his left acromioclavicular joint separation.  Overall, he is doing well.  He does have a physical therapy appointment for the first week of December, and he may follow up with the orthopedic office in approximately 6 weeks to be sure that he has had a resolution of his symptoms and improving his strength.    ___________________________  Reviewed and Electronically Signed By: Edwyna Ready MD  Sig Date: 10/08/2011  Sig Time: 08:06:14  Dictated By: Ronnald Collum PA-C  Dict Date: 09/17/2011 Dict Time: 05 07 PM    Dictation Date and Time:09/17/2011 17:07:39  Transcription Date and Time:09/20/2011 22:15:52  eScription Dictation id: [id number]  Confirmation # :[id number]

## 2011-10-11 ENCOUNTER — Ambulatory Visit (HOSPITAL_BASED_OUTPATIENT_CLINIC_OR_DEPARTMENT_OTHER): Payer: No Typology Code available for payment source | Admitting: Rehabilitative and Restorative Service Providers"

## 2011-10-11 DIAGNOSIS — M25519 Pain in unspecified shoulder: Secondary | ICD-10-CM

## 2011-10-11 NOTE — Patient Instructions (Addendum)
Rehabilitation Treatment Flowsheet    Precautions:    Date: 10/05/11 10/11/11        Initials: LR LR        Visit #: EVAL 2        POC Due Date: 11/02/10         Time:          HEP horiz abd, D2 flexion, flexion, extension, ER, prone Ts, LS stretch         Treatment(s)                General IE Completed  AC joint mobility WNL R and 4/6 L              Pt ed Discussed prognosis, POC, HEP x            Prone T's    3x10 L UE        1/2 Roller        horiz abd x30    BER x30    L D2 flexion x30        Wall Ball   x45 CW, CCW, up.down       Prone on Hands  R <> L in push-up position x10              Manual  Posterior GH G3-4 mobilizations in open pac and in IR    MWM Scaption 3x10 (inferior at AC/GH and scapular rotation)    x10 min            Ice    x10 min post

## 2011-10-11 NOTE — Progress Notes (Signed)
S: Pt reporting increased soreness after HEP, 2/10 pain today  O: Refer to Rehabilitation Treatment Flowsheet  A: Good outcome from MWM with less pain during shoulder elevation and improve scapulohumeral rhythm. Continues to show fatigue in L shoulder during stability activity d/t poor functional endurance  P: continues stab activity of L shoulder as tolerated

## 2011-10-14 ENCOUNTER — Ambulatory Visit (HOSPITAL_BASED_OUTPATIENT_CLINIC_OR_DEPARTMENT_OTHER): Payer: No Typology Code available for payment source | Admitting: Physical Therapy

## 2011-10-14 DIAGNOSIS — M25512 Pain in left shoulder: Secondary | ICD-10-CM

## 2011-10-15 NOTE — Patient Instructions (Signed)
Rehabilitation Treatment Flowsheet    Precautions:    Date: 10/05/11 10/11/11 10/14/11       Initials: LR LR MF       Visit #: EVAL 2 3       POC Due Date: 11/02/10         Time:   35       HEP horiz abd, D2 flexion, flexion, extension, ER, prone Ts, LS stretch         Treatment(s)                General IE Completed  AC joint mobility WNL R and 4/6 L              Pt ed Discussed prognosis, POC, HEP x            Prone T's    3x10 L UE "       1/2 Roller        horiz abd x30    BER x30    L D2 flexion x30 x45 each       Wall Ball   x45 CW, CCW, up.down       Prone on Hands  R <> L in push-up position x10 3x5             Manual  Posterior GH G3-4 mobilizations in open pac and in IR    MWM Scaption 3x10 (inferior at AC/GH and scapular rotation)    x10 min "           Ice    x10 min post "

## 2011-10-15 NOTE — Progress Notes (Signed)
S: Patient states that his left shoulder is feeling pretty good today.  He reports a pain score of 3/10 at start of session.  O: Refer to Rehabilitation Treatment Flowsheet  A: Patient showed good tolerance to most activities.  Additional cueing for proper form required with MWM activities.  Some shoulder stabilization exercises proved challenging due to inability of patient to reach full wrist extension on LUE.  Patient had better AROM results with manual assistance at scapula.  P: Continue per PT plan of care.

## 2011-10-19 ENCOUNTER — Ambulatory Visit (HOSPITAL_BASED_OUTPATIENT_CLINIC_OR_DEPARTMENT_OTHER): Payer: No Typology Code available for payment source | Admitting: Physical Therapy

## 2011-10-19 DIAGNOSIS — M25519 Pain in unspecified shoulder: Secondary | ICD-10-CM

## 2011-10-19 NOTE — Patient Instructions (Addendum)
Rehabilitation Treatment Flowsheet    Precautions:    Date: 10/05/11 10/11/11 10/14/11 10/19/11      Initials: LR LR MF MF      Visit #: EVAL 2 3 4       POC Due Date: 11/02/10         Time:   35 35      HEP horiz abd, D2 flexion, flexion, extension, ER, prone Ts, LS stretch         Treatment(s)                General IE Completed  AC joint mobility WNL R and 4/6 L              Pt ed Discussed prognosis, POC, HEP x            Prone T's    3x10 L UE " 3x15      1/2 Roller        horiz abd x30    BER x30    L D2 flexion x30 x45 each 3x10 yellow band each      Wall Ball   x45 CW, CCW, up.down "      Prone on Hands  R <> L in push-up position x10 3x5 "            Manual  Posterior GH G3-4 mobilizations in open pac and in IR    MWM Scaption 3x10 (inferior at AC/GH and scapular rotation)    x10 min " "          Ice    x10 min post " "          AAROM activities      Semi-reclined position, scaption, abduction, x30 each

## 2011-10-20 NOTE — Progress Notes (Signed)
S: Patient states that his left shoulder continues to feel better.  He reports a pain score of 3/10 at start of session.  O: Refer to Rehabilitation Treatment Flowsheet  A: Patient displayed improved form and AROM with MWM activity. He also showed good tolerance to addition of resistance to some exercises.  Some clicking in left AC joint noted during AAROM activity in semi-reclined position.  P: Continue per PT plan of care.

## 2011-10-21 ENCOUNTER — Ambulatory Visit (HOSPITAL_BASED_OUTPATIENT_CLINIC_OR_DEPARTMENT_OTHER): Payer: No Typology Code available for payment source | Admitting: Physical Therapy

## 2011-10-21 DIAGNOSIS — M25519 Pain in unspecified shoulder: Secondary | ICD-10-CM

## 2011-10-21 NOTE — Patient Instructions (Addendum)
Rehabilitation Treatment Flowsheet    Precautions:    Date: 10/05/11 10/11/11 10/14/11 10/19/11 10/21/11     Initials: LR LR MF MF MF     Visit #: EVAL 2 3 4 5      POC Due Date: 11/02/10         Time:   35 35 30     HEP horiz abd, D2 flexion, flexion, extension, ER, prone Ts, LS stretch         Treatment(s)                General IE Completed  AC joint mobility WNL R and 4/6 L              Pt ed Discussed prognosis, POC, HEP x            Prone T's    3x10 L UE " 3x15 "     1/2 Roller        horiz abd x30    BER x30    L D2 flexion x30 x45 each 3x10 yellow band each 3x15 each     Wall Ball   x45 CW, CCW, up.down " "     Prone on Hands  R <> L in push-up position x10 3x5 " x           Manual  Posterior GH G3-4 mobilizations in open pac and in IR    MWM Scaption 3x10 (inferior at AC/GH and scapular rotation)    x10 min " " "         Ice    x10 min post " " "         AAROM activities      Semi-reclined position, scaption, abduction, x30 each Unable to complete due to consistent "clicking" in shoulder with activity

## 2011-10-21 NOTE — Progress Notes (Signed)
S: Patient states that his shoulder is getting better.  He reports a pain score of 2/10 at start of session  O: Refer to Rehabilitation Treatment Flowsheet  A: Patient was able to complete most exercises successfully, however his left shoulder started to have some noticeable "clicking" around [facility] joint while performing shoulder stabilization exercise with ball on wall.  Clicking continued with activities in semi-reclined position, so they were stopped prior to completion.  P: Continue per PT

## 2011-10-28 ENCOUNTER — Ambulatory Visit (HOSPITAL_BASED_OUTPATIENT_CLINIC_OR_DEPARTMENT_OTHER): Payer: No Typology Code available for payment source | Admitting: Physical Therapy

## 2011-10-28 DIAGNOSIS — M25519 Pain in unspecified shoulder: Secondary | ICD-10-CM

## 2011-10-28 NOTE — Progress Notes (Signed)
S: Patient states that his shoulder had consistent clicking for a day after last session and then subsided.  He still has some clicking, but not as frequent as after last time he was here.  He reports a pain score of 2/10 at start of session.  O: Refer to Rehabilitation Treatment Flowsheet  A: Patient was able to complete all exercises without reproducing the clicking that he experienced last session.  Some tenderness noted at [facility] joint when anterior pressure was applied.  P: Continue per PT plan of care.

## 2011-10-28 NOTE — Patient Instructions (Signed)
Rehabilitation Treatment Flowsheet    Precautions:    Date: 10/05/11 10/11/11 10/14/11 10/19/11 10/21/11 10/28/11    Initials: LR LR MF MF MF MF    Visit #: EVAL 2 3 4 5 6     POC Due Date: 11/02/10         Time:   35 35 30 35    HEP horiz abd, D2 flexion, flexion, extension, ER, prone Ts, LS stretch         Treatment(s)                General IE Completed  AC joint mobility WNL R and 4/6 L              Pt ed Discussed prognosis, POC, HEP x            Prone T's    3x10 L UE " 3x15 " "    1/2 Roller        horiz abd x30    BER x30    L D2 flexion x30 x45 each 3x10 yellow band each 3x15 each "    Wall Ball   x45 CW, CCW, up.down " " Up,down, side to side, x45    Prone on Hands  R <> L in push-up position x10 3x5 " x 2x5          Manual  Posterior GH G3-4 mobilizations in open pac and in IR    MWM Scaption 3x10 (inferior at AC/GH and scapular rotation)    x10 min " " " "        Ice    x10 min post " " " "        AAROM activities      Semi-reclined position, scaption, abduction, x30 each Unable to complete due to consistent "clicking" in shoulder with activity Scaption x 30 able to complete without consistent clicking in shoulder

## 2011-10-29 ENCOUNTER — Ambulatory Visit (HOSPITAL_BASED_OUTPATIENT_CLINIC_OR_DEPARTMENT_OTHER): Payer: No Typology Code available for payment source | Admitting: Orthopaedic Surgery

## 2011-10-29 DIAGNOSIS — S43109A Unspecified dislocation of unspecified acromioclavicular joint, initial encounter: Secondary | ICD-10-CM

## 2011-10-29 NOTE — Progress Notes (Signed)
The primary progress note for this visit has been dictated through E-Scription. It can be viewed as an attachment to this encounter or through Chart Review under the Other Tab as an Orthopedic Office Note.      Review of Systems: Constitutional, Eyes, ENT/Mouth, Cardiovascular, Respiratory, GI, GU, Neuro, Psych, Heme/Lymph, Skin, Musculoskeletal was reviewed and is NEGATIVE except for what is dictated in the note.    MT ACCT #:  [id number]

## 2011-11-04 ENCOUNTER — Ambulatory Visit (HOSPITAL_BASED_OUTPATIENT_CLINIC_OR_DEPARTMENT_OTHER): Payer: No Typology Code available for payment source | Admitting: Physical Therapy

## 2011-11-04 DIAGNOSIS — M25512 Pain in left shoulder: Secondary | ICD-10-CM

## 2011-11-05 NOTE — Progress Notes (Signed)
S: Patient states that his shoulder has been feeling a little bit better recently.  He reports a pain score of 0/10 at start of session.  O: Refer to Rehabilitation Treatment Flowsheet  A: Patient continues to display improved AROM, with only minor discomfort noted at end ranges.  Shoulder stability exercises are causing increased pain in left wrist due to limited flexion.  Consider other stability exercises to take stress off of wrist.  P: Continue per PT plan of care.

## 2011-11-05 NOTE — Patient Instructions (Signed)
Rehabilitation Treatment Flowsheet    Precautions:    Date: 10/05/11 10/11/11 10/14/11 10/19/11 10/21/11 10/28/11 11/04/11   Initials: LR LR MF MF MF MF MF   Visit #: EVAL 2 3 4 5 6 7    POC Due Date: 11/02/10         Time:   35 35 30 35 30   HEP horiz abd, D2 flexion, flexion, extension, ER, prone Ts, LS stretch         Treatment(s)                General IE Completed  AC joint mobility WNL R and 4/6 L              Pt ed Discussed prognosis, POC, HEP x            Prone T's    3x10 L UE " 3x15 " " "   1/2 Roller        horiz abd x30    BER x30    L D2 flexion x30 x45 each 3x10 yellow band each 3x15 each " Red band, 3x10 each   Wall Ball   x45 CW, CCW, up.down " " Up,down, side to side, x45 "   Prone on Hands  R <> L in push-up position x10 3x5 " x 2x5 3x5 (stopped last set due to increasing wrist left pain)         Manual  Posterior GH G3-4 mobilizations in open pac and in IR    MWM Scaption 3x10 (inferior at AC/GH and scapular rotation)    x10 min " " " " "       Ice    x10 min post " " " " "       AAROM activities      Semi-reclined position, scaption, abduction, x30 each Unable to complete due to consistent "clicking" in shoulder with activity Scaption x 30 able to complete without consistent clicking in shoulder X

## 2011-11-08 ENCOUNTER — Ambulatory Visit (HOSPITAL_BASED_OUTPATIENT_CLINIC_OR_DEPARTMENT_OTHER): Payer: No Typology Code available for payment source | Admitting: Rehabilitative and Restorative Service Providers"

## 2011-11-08 DIAGNOSIS — M25519 Pain in unspecified shoulder: Secondary | ICD-10-CM

## 2011-11-08 NOTE — Patient Instructions (Signed)
Rehabilitation Treatment Flowsheet    Precautions:    Date: 10/05/11 10/11/11 10/14/11 10/19/11 10/21/11 10/28/11 11/04/11 11/08/11   Initials: LR LR MF MF MF MF MF LR   Visit #: EVAL 2 3 4 5 6 7 8    POC Due Date: 11/02/10          Time:   35 35 30 35 30 30   HEP horiz abd, D2 flexion, flexion, extension, ER, prone Ts, LS stretch       Dc today   Treatment(s)                 General IE Completed  AC joint mobility WNL R and 4/6 L      Dc today         Pt ed Discussed prognosis, POC, HEP x      discussed dc, prognosis, posture, work-related safety activity, lifting techniques       Prone T's    3x10 L UE " 3x15 " " " x   1/2 Roller        horiz abd x30    BER x30    L D2 flexion x30 x45 each 3x10 yellow band each 3x15 each " Red band, 3x10 each x   Wall Ball   x45 CW, CCW, up.down " " Up,down, side to side, x45 " x   Prone on Hands  R <> L in push-up position x10 3x5 " x 2x5 3x5 (stopped last set due to increasing wrist left pain) x         Manual  Posterior GH G3-4 mobilizations in open pac and in IR    MWM Scaption 3x10 (inferior at AC/GH and scapular rotation)    x10 min " " " " " x       Ice    x10 min post " " " " " x       AAROM activities      Semi-reclined position, scaption, abduction, x30 each Unable to complete due to consistent "clicking" in shoulder with activity Scaption x 30 able to complete without consistent clicking in shoulder X x

## 2011-11-08 NOTE — Progress Notes (Signed)
PHYSICAL THERAPY  DISCHARGE REPORT    DIAGNOSIS: L shoulder pain  MD: Clydie Braun A. Brown Human, PA-C    Your patient, Craig Peterson, has been discharged from Physical Therapy after a total of 8 visits.    REASON(S) FOR DISCHARGE:    Subjective:   Pt continues to report pain during extremes of ROM, yet severity has decreased. He reports that he has returned to work at [employer] about 3 weeks ago without difficulty. He is able to lift heavy objects without restriction. He no longer has difficulty tucking in his shirt. He continues to have occasional pain on left side of his neck. Reports performing HEP 3-4x per week and has relief from the activty   OBJECTIVE:   POSTURE: slightly forward head with increased CSpine extension, rounded shoulders   AWARENESS: fair   PALPATION: TTP - L shoulder AC joint on anterior and lateral aspect, L UT   MUSCLE LENGTH:   Pectorals: mild restriction   OTHER: distal end of L clavical palpable and visibly elevated       LEFT  RIGHT    SHOULD  A/PROM  MMT  A/PROM  MMT    FLEX.  175  5 180  5    EXT.   5   5    IR  T8  5  T8  5    ER  T1  5  T1  5    ABD  180  5  180  5    H. ABD        H. ADD        Low Trap   4+  4+    Mid Trap   4+  4+    ELBOW        FLEX.        EXT.        WRIST        FLEX.        EXT.        SUP/PRON          JOINT MOBILITY     Gleno-Humeral  RIGHT  LEFT     Posterior  3/6  2/6     Inferior       Anterior       *Apprehension Test       AC Joint  3/6  3/6     TSpine       PA  3/6  3/6     CSpine       PA       CERVICAL SPINE ROM      Cervical Spine ROM  Degrees and End-feel      Flexion  WNL throughout C Spine      Extension       SB Right       SB Left       Rotation Right       Rotation Left       SPECIAL TESTS      IMPINGEMENT  L  R  CERVICAL SPINE  L  R  ROTATOR CUFF  L  R    Hawkins impingement  -   C-Spine Distraction    Empty can  -    Neer's  -  Spurlings Test    Drop-Arm  -     Resisted ER  -   Sharp-Purser    Resisted ER  -     Painful Arc  -  AC JOINT    LABRUM         Active Compression  -  Biceps Load  -        Assessment   Referring Provider: Clydie Braun A. Brown Human, PA-C   [address]   [place], [place] [address]   Diagnosis: L shoulder pain   Assessment: The patient is a 51 year old male who reports today for re-evaluation of his L shoulder pain. Since beginning therapy he has made significant progress with improvements in ROM, strength, and a joint mobility. He has become independent with a home exercise program and has improved his postural awareness. All improvements have contributed to improvements in function and ability to return to full-duty at work. He will transitioning today to performance of his HEP at home without requirement of attendance in the clinic. He has met all his rehab goals as listed below  Rehabilitation Goals   Short-term Goals   Pt will be indep with his HEP in 2 weeks - met  Pt will demonstrate normalized GH, AC and TSpine joint mobility in 3 weeks - met  Pt will normalize L shoulder ROM in all planes in 4 weeks to assist with tolerance of ADLs - met  Pt will improve L shoulder strength by 1/2 MMT grade in all directions in 4 weeks - met  Pt will improve B periscapular strength by 1/2 MMT grade in 4 weeks. - met  Pt will improve postural awareness from fair to good without cuing in 4 weeks - met   Long-term Goals   Pt will have no difficulty with liftng >50# in 8 weeks to improve tolerance of work-related activity - met          RECOMMENDATIONS:    This patient should: contact myself with any questions or concerns regarding his current condition    COMMENTS:    If you have any questions please feel free to contact the department at Dept: [phone].    Therapist: Mitchel Honour PT

## 2011-12-10 ENCOUNTER — Ambulatory Visit (HOSPITAL_BASED_OUTPATIENT_CLINIC_OR_DEPARTMENT_OTHER): Payer: Self-pay | Admitting: Internal Medicine

## 2011-12-13 ENCOUNTER — Ambulatory Visit (HOSPITAL_BASED_OUTPATIENT_CLINIC_OR_DEPARTMENT_OTHER): Payer: Self-pay | Admitting: Internal Medicine

## 2011-12-16 NOTE — Progress Notes (Signed)
Date of Service: 10/29/2011    DIAGNOSIS:  Left acromioclavicular joint separation.    HISTORY OF PRESENT ILLNESS:  Mr. Valletta is a 51 year old male who injured his left shoulder in late October.  He did recently begin a course of physical therapy.  He tells me his shoulder is feeling much better.  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  He does feel at though his shoulder is improving with time and physical therapy.    PHYSICAL EXAMINATION:    GENERAL:  Mr. Vandermeulen is alert and oriented.  MUSCULOSKELETAL:  To inspection of his bilateral shoulders there is a slight AC prominence on the right and a more significant prominence on the left.  To palpation of this region, instability is not detected.  No significant motion at the [facility] joint and no tenderness to the [facility] joint and surrounding regions.  He does have full range of motion.  Rotator cuff strength is tested to be present and strength resistance testing of the rotator cuff does not cause him significant discomfort.      ASSESSMENT AND PLAN:  Mr. Siegel returns to the orthopedic office today.  He shows excellent range of motion and minimal discomfort to his left shoulder.  He has been able to return to his regular work activities.  He will continue and finish his physical therapy course.  He is slightly concerned about the prominence of his shoulder; however, he is reassured that this does not cause him any significant functional disability, and it is all cosmetic.  If he does develop pain in this region, he should return to the orthopedic office.    ___________________________  Reviewed and Electronically Signed By: Edwyna Ready MD  Sig Date: 12/16/2011  Sig Time: 07:03:05  Dictated By: Ronnald Collum PA-C  Dict Date: 10/29/2011 Dict Time: 06 15 PM    Dictation Date and Time:10/29/2011 18:15:39  Transcription Date and Time:11/08/2011  13:51:26  eScription Dictation id: [id number] Confirmation # :[id number]      cc: Greer Ee MD

## 2012-02-11 ENCOUNTER — Ambulatory Visit (HOSPITAL_BASED_OUTPATIENT_CLINIC_OR_DEPARTMENT_OTHER): Payer: No Typology Code available for payment source | Admitting: Internal Medicine

## 2012-02-11 VITALS — BP 106/64 | HR 54 | Temp 98.1°F | Wt 146.0 lb

## 2012-02-11 DIAGNOSIS — H11002 Unspecified pterygium of left eye: Secondary | ICD-10-CM

## 2012-02-11 DIAGNOSIS — M25572 Pain in left ankle and joints of left foot: Secondary | ICD-10-CM

## 2012-02-11 DIAGNOSIS — H538 Other visual disturbances: Secondary | ICD-10-CM

## 2012-02-11 NOTE — Progress Notes (Signed)
Cc: eye problem & ankle problem  Eye problem  Would like to get his vision checked & also noted that he has a growth in his left eye which has been itching & irritated off-and-on recently but does not interfere with his vision    Ankle pain  He has intermittent pain in his left ankle when he steps in certain way or twists it awkwardly  This has been present for last 4-6 wks  Pain 2/10 when occurs & then resolves quickly  No trauma, redness or swelling  Has been icing his ankle    BP 106/64   Pulse 54   Temp(Src) 98.1 F (36.7 C) (Oral)   Wt 146 lb (66.225 kg)  Left eye normal, pupil reactive, normal conjunctiva, + pterygium on left   Ankle exam - left normal; full range of motion, no pain on motion, no effusion, tenderness, ligamentous instability or deformity noted.     ASSESSMENT & PLAN:  719.47P Left ankle pain  (primary encounter diagnosis)  Comment: given the normal exam, no hx of trauma, we will observe at this point    368.8A Blurred vision  Comment: he would like an eye check-up altho' he understands that the there won't be a surgical tx for his pterygium at this point given it's small size  Plan: REFERRAL TO OPHTHALMOLOGY ( INT)            372.40J Pterygium of left eye  Comment: natural tears prn, reassurance        follow-up will be scheduled for 6 months from now  he has been advised to call or return with any worsening or new problems

## 2012-03-24 ENCOUNTER — Ambulatory Visit (HOSPITAL_BASED_OUTPATIENT_CLINIC_OR_DEPARTMENT_OTHER): Payer: Self-pay | Admitting: Ophthalmology

## 2012-03-24 DIAGNOSIS — H11159 Pinguecula, unspecified eye: Secondary | ICD-10-CM

## 2012-03-24 MED ORDER — KETOROLAC TROMETHAMINE 0.5 % OP SOLN
1.00 [drp] | Freq: Four times a day (QID) | OPHTHALMIC | Status: AC
Start: 2012-03-24 — End: 2012-06-24

## 2012-03-24 NOTE — Progress Notes (Signed)
Craig Peterson was seen at the [facility] center for c.o. A bump OS.    On exam he has a mildly inflamed pinguecula OS.    He will try using acular 1 gtt OS qid.    He will rtc prn/1 year.
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
## 2012-04-14 ENCOUNTER — Ambulatory Visit (HOSPITAL_BASED_OUTPATIENT_CLINIC_OR_DEPARTMENT_OTHER): Payer: Self-pay | Admitting: Internal Medicine

## 2012-07-09 ENCOUNTER — Encounter (HOSPITAL_BASED_OUTPATIENT_CLINIC_OR_DEPARTMENT_OTHER): Payer: Self-pay

## 2012-07-09 ENCOUNTER — Emergency Department (HOSPITAL_BASED_OUTPATIENT_CLINIC_OR_DEPARTMENT_OTHER)
Admission: RE | Admit: 2012-07-09 | Disposition: A | Discharge status: Home / Self Care | Payer: Self-pay | Source: Emergency Department | Attending: Emergency Medicine | Admitting: Emergency Medicine

## 2012-07-09 LAB — XR SHOULDER RIGHT MINIMUM 2 VIEWS

## 2012-07-09 MED ORDER — IBUPROFEN 600 MG PO TABS
600.0000 mg | ORAL_TABLET | Freq: Three times a day (TID) | ORAL | Status: AC | PRN
Start: 2012-07-09 — End: 2012-07-16

## 2012-07-09 MED ORDER — IBUPROFEN 600 MG PO TABS
ORAL_TABLET | ORAL | Status: DC
Start: 2012-07-09 — End: 2012-07-09
  Filled 2012-07-09: qty 1

## 2012-07-09 MED ORDER — IBUPROFEN 600 MG PO TABS
600.00 mg | ORAL_TABLET | Freq: Once | ORAL | Status: AC
Start: 2012-07-09 — End: 2012-07-09
  Administered 2012-07-09: 600 mg via ORAL

## 2012-07-09 NOTE — ED Notes (Signed)
Pt to xray
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
## 2012-07-09 NOTE — ED Notes (Signed)
Pain decreased to 6/10.

## 2012-07-09 NOTE — ED Notes (Signed)
Pt re evaled by MD and discharged with written instructions. Pt verbalizes understanding of instructions, pt teachings and plan of care.

## 2012-07-09 NOTE — ED Triage Note (Signed)
[name] and hit right shoulder playing soccer. Pain and immobility.

## 2012-07-11 NOTE — ED Provider Notes (Signed)
I performed a history and physical examination of the patient and discussed the patient's management with Meghan Viveiros, PA-C. I reviewed the PA's note and agree with the documented finding and plan of care.    Briefly, this 51 year old male patient who presented with right shoulder pain following a fall while playing soccer.    On my physical exam:  No acute distress.  Diffuse discomfort with palpation of the right shoulder without significant deformity.  Full passive range of motion but painful.  Patient not able to independently fully abductor the right arm.  Normal lower arm strength and sensation.  2+ radial pulse.    ED course/Medical Decision-Making:  Patient with right shoulder injury following fall, physical exam concerning for possible rotator cuff injury given inability to abduct independently.  Neurovascularly intact. X-rays were obtained, which showed a right a.c. joint separation which is also consistent with the patient's symptoms.  Agree with symptomatic treatment and PCP followup for further evaluation and management.    Condition on discharge:  Improved and stable    Diagnosis/diagnoses:  Shoulder injury  (primary encounter diagnosis)    Karie Kirks, MD

## 2012-07-12 ENCOUNTER — Encounter (HOSPITAL_BASED_OUTPATIENT_CLINIC_OR_DEPARTMENT_OTHER): Payer: Self-pay | Admitting: Internal Medicine

## 2012-07-12 DIAGNOSIS — M25511 Pain in right shoulder: Secondary | ICD-10-CM | POA: Insufficient documentation

## 2012-07-20 ENCOUNTER — Encounter (HOSPITAL_BASED_OUTPATIENT_CLINIC_OR_DEPARTMENT_OTHER): Payer: Self-pay | Admitting: Internal Medicine

## 2012-07-20 ENCOUNTER — Ambulatory Visit (HOSPITAL_BASED_OUTPATIENT_CLINIC_OR_DEPARTMENT_OTHER): Payer: No Typology Code available for payment source | Admitting: Internal Medicine

## 2012-07-20 VITALS — BP 110/75 | HR 66 | Temp 98.3°F | Ht 69.0 in | Wt 144.0 lb

## 2012-07-20 DIAGNOSIS — S43101A Unspecified dislocation of right acromioclavicular joint, initial encounter: Secondary | ICD-10-CM

## 2012-07-20 DIAGNOSIS — Z23 Encounter for immunization: Secondary | ICD-10-CM

## 2012-07-20 DIAGNOSIS — Z0000 Encounter for general adult medical examination without abnormal findings: Secondary | ICD-10-CM
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
## 2012-07-20 NOTE — Progress Notes (Signed)
07/20/2012  VIS given prior to administration and reviewed with the patient and or legal guardian.   Patient understands the disease and the vaccine.   See immunization/Injection module or chart review for date of publication and additional information.  Vaccine given in 07/20/2012 without difficulty.   Craig Peterson denies being allergic to eggs and having Guillian Barre Syndrome.    He was given recent VIS.  Patient understands teaching. Fiona denies any questions.     Altamese Carolina RN

## 2012-08-08 ENCOUNTER — Ambulatory Visit (HOSPITAL_BASED_OUTPATIENT_CLINIC_OR_DEPARTMENT_OTHER): Payer: No Typology Code available for payment source | Admitting: Lab

## 2012-08-08 DIAGNOSIS — Z0000 Encounter for general adult medical examination without abnormal findings: Secondary | ICD-10-CM

## 2012-08-08 LAB — POC IMMUNOASSAY FECAL OCCULT BLOOD TEST: POC FECAL OCCULT BLOOD TEST (IMMUNOASSAY): NEGATIVE

## 2012-08-08 NOTE — Progress Notes (Signed)
.  test resulted  rct

## 2013-02-23 LAB — XR SHOULDER LEFT MINIMUM 2 VIEWS

## 2013-02-23 LAB — XR FINGER(S) LEFT MINIMUM 2 VIEWS

## 2013-02-23 LAB — EKG

## 2013-07-31 ENCOUNTER — Ambulatory Visit (HOSPITAL_BASED_OUTPATIENT_CLINIC_OR_DEPARTMENT_OTHER): Payer: No Typology Code available for payment source | Admitting: Internal Medicine

## 2013-07-31 ENCOUNTER — Encounter (HOSPITAL_BASED_OUTPATIENT_CLINIC_OR_DEPARTMENT_OTHER): Payer: Self-pay | Admitting: Internal Medicine

## 2013-07-31 VITALS — BP 112/70 | HR 48 | Temp 98.4°F | Wt 148.6 lb

## 2013-07-31 DIAGNOSIS — E049 Nontoxic goiter, unspecified: Secondary | ICD-10-CM

## 2013-07-31 DIAGNOSIS — K219 Gastro-esophageal reflux disease without esophagitis: Secondary | ICD-10-CM

## 2013-07-31 MED ORDER — RANITIDINE HCL 150 MG PO CAPS
150.00 mg | ORAL_CAPSULE | Freq: Two times a day (BID) | ORAL | Status: AC
Start: 2013-07-31 — End: 2013-09-30
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
## 2013-08-02 ENCOUNTER — Ambulatory Visit: Payer: Self-pay | Admitting: Internal Medicine

## 2013-08-02 DIAGNOSIS — E049 Nontoxic goiter, unspecified: Secondary | ICD-10-CM

## 2013-08-03 LAB — US THYROID

## 2013-08-06 ENCOUNTER — Telehealth (HOSPITAL_BASED_OUTPATIENT_CLINIC_OR_DEPARTMENT_OTHER): Payer: Self-pay | Admitting: Internal Medicine

## 2013-08-06 DIAGNOSIS — R070 Pain in throat: Secondary | ICD-10-CM

## 2013-08-06 NOTE — Progress Notes (Signed)
Please tell patient his thyroid u/s was normal

## 2013-08-07 NOTE — Progress Notes (Addendum)
Will refer to ENT for evaluation

## 2013-08-07 NOTE — Progress Notes (Addendum)
Call to patient, advised as below

## 2013-08-07 NOTE — Addendum Note (Signed)
Addended by: Sharlyne Pacas on: 08/07/2013 11:14 AM     Modules accepted: Disposition Section

## 2013-08-07 NOTE — Progress Notes (Signed)
Call to pt and relayed dr. Levada Schilling message.  Pt states he continues to feel that there is something stuck on the left side of his throat.   Denies other sx such as nausea, vomit, inability to swallow, indigestion, heartburn.     To dr. Conni Slipper to review and advise.     Cammie Mcgee, LPN

## 2013-08-07 NOTE — Progress Notes (Signed)
Please ask patient if he continues to have throat sensation after starting zantac

## 2013-08-21 ENCOUNTER — Ambulatory Visit (HOSPITAL_BASED_OUTPATIENT_CLINIC_OR_DEPARTMENT_OTHER): Payer: No Typology Code available for payment source | Admitting: Otolaryngology

## 2013-08-21 ENCOUNTER — Encounter (HOSPITAL_BASED_OUTPATIENT_CLINIC_OR_DEPARTMENT_OTHER): Payer: Self-pay | Admitting: Internal Medicine

## 2013-08-21 VITALS — BP 117/74 | HR 59 | Temp 98.1°F

## 2013-08-21 DIAGNOSIS — R0989 Other specified symptoms and signs involving the circulatory and respiratory systems: Secondary | ICD-10-CM

## 2013-08-21 DIAGNOSIS — R09A2 Foreign body sensation, throat: Secondary | ICD-10-CM

## 2013-08-21 MED ORDER — OMEPRAZOLE 20 MG PO CPDR
20.00 mg | DELAYED_RELEASE_CAPSULE | Freq: Two times a day (BID) | ORAL | Status: AC
Start: 2013-08-21 — End: 2014-01-28

## 2013-08-21 NOTE — Progress Notes (Signed)
HPI: 52 year old male Craig Peterson is complaining of a FB sensation on the left side of the throat.  He denies dysphagia of solids or liquids.  This started about a month ago.  He does not use tobacco products.  He denies reflux symptoms.  He was started on Ranitidine about 2-3 weeks ago without improvement    Past medical, social and family history reviewed in EPIC.    Review of Systems -   Fever No  Cough No  Easy Bruising No  Blurred Vision No  Palpitations No  Heartburn No  Stiff Neck No  Headache No  Skin Lesions of the Face No    EXAM  General: WD WN male with a normal voice.  Face: No lesions.  Facial Strength: Normal and symmetric.  Eyes: PERRLA and EOMI.  Ear R: No lesions. Cerumen: none / minimal. Canal clear. TM clear.   Ear L:  No lesions. Cerumen: none / minimal. Canal clear. TM clear.  Nose:  Septum midline.  Turbinates normal size.  No polyps or masses.  Nasopharynx:  Normal to exam    Oral Cavity/Oropharynx:  Mucous membranes moist.  Tonsils 0+.  Teeth in good condition.  Tongue no lesions.  Flexible nasolaryngoscopy: the nose was topically anesthetized with 4% lidocaine and Afrin pledgets.    Hypopharynx: Base of tongue no masses. Pyriform sinuses clear.  Larynx: Epiglottis no edema or lesions. Vocal cords: mobile with no lesions or polyps with severe post glottic erythema.  Neck:  Trachea midline.  No palpable masses.  Thyroid: Normal to palpation.  Lymph:  No palpable nodes.  Subman glands:  Normal size, non-tender.  Parotids: Normal size, no masses or nodes.    A/P  52 year old male with left sided globus sensation.  He has physical evidence highly suggestive of reflux.  I recommended he switch to bid Omeprazole and prescribed for 3-4 months. I recommended avoiding late night eating and lying down after eating.  Will have F/u 3 months.  If not improved will consider imaging.  This interview was conducted in the presence of an interpreter.

## 2013-09-20 ENCOUNTER — Telehealth (HOSPITAL_BASED_OUTPATIENT_CLINIC_OR_DEPARTMENT_OTHER): Payer: Self-pay | Admitting: Licensed Practical Nurse

## 2013-09-20 NOTE — Progress Notes (Signed)
Call to pt who reports he strained the muscle on his left thigh playing soccer almost two months ago. The thigh got bruised back then and he applied ice and warm compresses to the area.    He continues to feel discomfort and little pain behind the knee and he feels he does not have strength on his left leg.   Also feels some numbness at times next to the left calf.   Denies severe pain, difficulty walking, swelling of the area, red streaks down the leg.   Appt scheduled with dr. Eulah Pont for tomorrow.     To PCP to review and advise if needed. Cammie Mcgee, LPN

## 2013-09-20 NOTE — Telephone Encounter (Signed)
Message copied by Atha Starks on Thu Sep 20, 2013 12:39 PM  ------       Message from: Laury Deep       Created: Thu Sep 20, 2013 12:25 PM       Regarding: legs hurting       Contact: [phone]         Craig Peterson is a 52 year old old male.              Patient's PCP: Greer Ee, MD              In case we get disconnected what is the best number to reach you at today:       Mobile Phone Telephone Information:       Mobile          [phone]                            Person calling:       Patient (self)              How can I help you today:        Sick Call:         What is your main complaint today?       legs hurting played soccer for last 2 months no relief with ice               The nurse will call you back within one hour.  She may call you at anytime. Can you remain at your telephone for the next 60 minutes?       Yes                       Patient's language of care: [place]              Would you like an interpreter when the nurse calls you back?       NO  ------

## 2013-09-20 NOTE — Progress Notes (Signed)
Katio, please change this - -he should see me tomorrow  Can add him on at 11:50am  Thanks, Haydee Monica

## 2013-09-21 ENCOUNTER — Encounter (HOSPITAL_BASED_OUTPATIENT_CLINIC_OR_DEPARTMENT_OTHER): Payer: Self-pay | Admitting: Internal Medicine

## 2013-09-21 ENCOUNTER — Ambulatory Visit: Payer: Self-pay | Admitting: Internal Medicine

## 2013-09-21 ENCOUNTER — Ambulatory Visit (HOSPITAL_BASED_OUTPATIENT_CLINIC_OR_DEPARTMENT_OTHER): Payer: No Typology Code available for payment source | Admitting: Internal Medicine

## 2013-09-21 VITALS — BP 92/54 | HR 56 | Temp 98.3°F | Wt 147.0 lb

## 2013-09-21 DIAGNOSIS — M47817 Spondylosis without myelopathy or radiculopathy, lumbosacral region: Secondary | ICD-10-CM

## 2013-09-21 DIAGNOSIS — Z0000 Encounter for general adult medical examination without abnormal findings: Secondary | ICD-10-CM

## 2013-09-21 DIAGNOSIS — M5432 Sciatica, left side: Secondary | ICD-10-CM | POA: Insufficient documentation

## 2013-09-21 DIAGNOSIS — M4120 Other idiopathic scoliosis, site unspecified: Secondary | ICD-10-CM

## 2013-09-21 LAB — XR LUMBAR SPINE 2 OR 3 VIEWS
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
## 2013-09-24 ENCOUNTER — Encounter (HOSPITAL_BASED_OUTPATIENT_CLINIC_OR_DEPARTMENT_OTHER): Payer: Self-pay | Admitting: Internal Medicine

## 2013-10-16 ENCOUNTER — Ambulatory Visit (HOSPITAL_BASED_OUTPATIENT_CLINIC_OR_DEPARTMENT_OTHER): Payer: No Typology Code available for payment source | Admitting: Lab

## 2013-10-16 DIAGNOSIS — Z1211 Encounter for screening for malignant neoplasm of colon: Secondary | ICD-10-CM

## 2013-10-16 LAB — POC IMMUNOASSAY FECAL OCCULT BLOOD TEST: POC FECAL OCCULT BLOOD TEST (IMMUNOASSAY): NEGATIVE

## 2013-10-16 NOTE — Progress Notes (Signed)
Lab completed.

## 2013-10-17 ENCOUNTER — Ambulatory Visit (HOSPITAL_BASED_OUTPATIENT_CLINIC_OR_DEPARTMENT_OTHER): Payer: No Typology Code available for payment source | Admitting: Physical Medicine & Rehabilitation

## 2013-10-17 DIAGNOSIS — G5732 Lesion of lateral popliteal nerve, left lower limb: Secondary | ICD-10-CM

## 2013-10-17 DIAGNOSIS — M2390 Unspecified internal derangement of unspecified knee: Secondary | ICD-10-CM

## 2013-10-17 NOTE — Progress Notes (Signed)
The primary progress note for this visit has been dictated through E-Scription. It can be viewed as an attachment to this encounter or through Chart Review under the Notes Tab as an Orthopedic Office Note.      Review of Systems: Constitutional, Eyes, ENT/Mouth, Cardiovascular, Respiratory, GI, GU, Neuro, Psych, Heme/Lymph, Skin, Musculoskeletal was reviewed and is NEGATIVE except for what is dictated in the note.    MT ACCT #:  [id number]

## 2013-10-17 NOTE — Progress Notes (Signed)
Date of Service: 10/17/2013    The patient was referred from his primary care provider's office, Dr. Greer Ee, for evaluation of left-sided sciatica.    The patient complains of left leg numbness, weakness, knee pain making ambulation difficult.    He reports that he lost significant strength in the left leg and occasionally describes instability in the knee like leg gives way.   He started to experience such symptoms after a soccer injury while hyperextending and twisting the knee, fell down.    The patient did not receive specific treatment, continued to work and even tried to play soccer.    While sitting in the room patient reports no pain at all, just numbness and weakness in the leg.    The patient denied any back problems/pain.    PAST MEDICAL HISTORY:  Noticeable for vitiligo, shoulder pain, left-sided sciatica, mallet finger, AC joint separation.    MEDICATIONS:  Include Prilosec.    ALLERGIES:  NKDA    SOCIAL HISTORY:  The patient works in Arts development officer.    He is married, family lives in [place].    The patient does not smoke or abuse alcohol; he drives a car.    The rest of the medical history, family history, social history, and review of systems is as per EPIC and encounter form.    PHYSICAL EXAMINATION:  Pleasant, cooperative, 52 year old  Portuguese-speaking male, appears not in obvious pain or distress, sitting comfortably in examination room; we communicated with face-to-face [place] interpreter.    The patient ambulates with symmetrical gait without assistive devices, his skin is intact over upper, lower limbs/torso.    There is no lymphadenopathy, good capillary refill on extremities.  The patient can easily stay and walk on toes, but while on heels,   Dorsiflexion of the left foot not complete secondary to weakness, but advancing the left foot without trapping the floor.  On inspection of patient's knee, there is mild quadriceps hypotrophy, appeared more laxity than of the right knee,  especially on AP directions, but anterior drawer sign is not confirmatory.    Evaluating range of motion, there is some clicking sensation in the knee on maximal extension; the pain is reproduced behind the knee in popliteal fossa.    Manual muscle testing revealed decrease of strength left foot dorsiflexors to 4-/5.    On sensation testing, although patient reports hypoesthesia over the thigh and leg, not in dermatomal distribution, there is more numbness over lateral leg.    IMPRESSION:  The patient's presentation is more consistent with left knee internal derangement with stretching, injuring peroneal nerve.  On palpation, there is tenderness behind the fibular head but no dysesthesia reproduction.   I referred patient to physical therapy, recommended to use high ankle shoes, scheduled for electrodiagnostic evaluation on December 26, and referred to left knee MRI.    I will see patient on the test day.    I spent with patient 45 minutes, more than 50% of time on counseling.    ___________________________  Reviewed and Electronically Signed By: Cristine Polio MD  Sig Date: 10/17/2013  Sig Time: 20:31:51  Dictated By: Cristine Polio MD  Dict Date: 10/17/2013 Dict Time: 04 49 PM    Dictation Date and Time:10/17/2013 16:49:31  Transcription Date and Time:10/17/2013 18:33:44  eScription Dictation id: [id number] Confirmation # :[id number]      cc: Greer Ee MD

## 2013-10-19 ENCOUNTER — Encounter (HOSPITAL_BASED_OUTPATIENT_CLINIC_OR_DEPARTMENT_OTHER): Payer: Self-pay | Admitting: Internal Medicine

## 2013-10-22 ENCOUNTER — Ambulatory Visit: Payer: Self-pay | Admitting: Physical Medicine & Rehabilitation

## 2013-10-22 DIAGNOSIS — S83289A Other tear of lateral meniscus, current injury, unspecified knee, initial encounter: Secondary | ICD-10-CM

## 2013-10-22 DIAGNOSIS — M2240 Chondromalacia patellae, unspecified knee: Secondary | ICD-10-CM

## 2013-10-22 DIAGNOSIS — M25469 Effusion, unspecified knee: Secondary | ICD-10-CM

## 2013-10-22 DIAGNOSIS — X58XXXA Exposure to other specified factors, initial encounter: Secondary | ICD-10-CM

## 2013-10-22 DIAGNOSIS — Y9366 Activity, soccer: Secondary | ICD-10-CM

## 2013-10-23 LAB — MRI KNEE LEFT WO CONTRAST

## 2013-10-26 ENCOUNTER — Ambulatory Visit (HOSPITAL_BASED_OUTPATIENT_CLINIC_OR_DEPARTMENT_OTHER): Payer: No Typology Code available for payment source | Admitting: Physical Medicine & Rehabilitation

## 2013-10-26 DIAGNOSIS — G5732 Lesion of lateral popliteal nerve, left lower limb: Secondary | ICD-10-CM

## 2013-10-26 NOTE — Progress Notes (Signed)
The primary progress note for this visit has been dictated through E-Scription. It can be viewed as an attachment to this encounter or through Chart Review under the Notes Tab as an Orthopedic Office Note.      Review of Systems: Constitutional, Eyes, ENT/Mouth, Cardiovascular, Respiratory, GI, GU, Neuro, Psych, Heme/Lymph, Skin, Musculoskeletal was reviewed and is NEGATIVE except for what is dictated in the note.    MT ACCT #:  [id number]

## 2013-10-26 NOTE — Progress Notes (Signed)
Date of Service: 10/26/2013    This is a scheduled visit for electrodiagnostic evaluation of the left lower limb.    Indications, H&P -- as per my office note from December 17.     On examination today, strain to the foot dorsiflexors noticeably improved.     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     Electrodiagnostic test was explained to the patient, and we agreed to proceed.    Nerve conduction study data are stored and obtainable from the EMG folder on the ortho drive.    The following muscles were sampled with concentric needle electrode on the left side - EGB, peroneus longus, tibialis anterior, gastrocsoleus, vastus medialis, short head of the biceps femorus, gluteus medius, gluteus maximus, tensor fascia lata, medial hamstrings.    RESULTS:  Motor, as well as sensory nerve conduction study were essentially normal with preserved latencies, amplitudes, and conduction velocities.    Electromyography revealed +1/+2 subacute denervation features in the form of positive sharp wave and fibrillation potentials testing peroneus longus, to a lesser degree in EGB and tibialis anterior.  There are no denervation features testing short head of biceps femoris.    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  There is no electrodiagnostic evidence of lumbosacral radiculopathy/plexopathy, other isolated mononeuropathy, or peripheral polyneuropathy.          ___________________________  Reviewed and Electronically Signed By: Cristine Polio MD  Sig Date: 10/26/2013  Sig Time: 19:34:43  Dictated By: Cristine Polio MD  Dict Date: 10/26/2013 Dict Time: 09 54 AM    Dictation Date and Time:10/26/2013 09:54:19  Transcription Date and Time:10/26/2013 14:40:45  eScription Dictation id: [id number] Confirmation # :[id number]

## 2013-10-29 ENCOUNTER — Encounter (HOSPITAL_BASED_OUTPATIENT_CLINIC_OR_DEPARTMENT_OTHER): Payer: Self-pay | Admitting: Otolaryngology

## 2013-11-14 ENCOUNTER — Ambulatory Visit (HOSPITAL_BASED_OUTPATIENT_CLINIC_OR_DEPARTMENT_OTHER): Payer: No Typology Code available for payment source | Admitting: Orthopaedic Surgery

## 2013-11-14 DIAGNOSIS — M5432 Sciatica, left side: Secondary | ICD-10-CM

## 2013-11-14 MED ORDER — IBUPROFEN 600 MG PO TABS
600.00 mg | ORAL_TABLET | Freq: Three times a day (TID) | ORAL | Status: AC
Start: 2013-11-14 — End: 2013-12-14

## 2013-11-14 NOTE — Progress Notes (Signed)
Date of Service: 11/14/2013    ADDENDUM    I personally examined and spoke with Mr. Craig Peterson.  He has pain in his knee, but it is all in the posterior aspect of his knee, seems to be more of a tendinitis type problem in his hamstrings.  Whenever he stretches his hamstrings, he seems to get some discomfort.  He does not have any pain with squatting.  He does not have any pain with running and walking, and he is really able to function quite well.  It is just when he stretches the back of his knee, he feels like it hurts.  He did have some sort of a hyperextension type injury to his knee, but he has negative anterior drawer test.  He had an MRI scan of his knee, which shows torn medial meniscus, but he really does not have meniscal type symptoms.  His ACL is intact on his MRI scan.    We are going to treat him with physical therapy and some medication.  He will return in 6 weeks for followup.    ___________________________  Reviewed and Electronically Signed By: Edwyna ReadyWILLIAM Bedford Winsor MD  Sig Date: 11/15/2013  Sig Time: 06:57:45  Dictated By: Edwyna ReadyWILLIAM Shanedra Lave MD  Dict Date: 11/14/2013 Dict Time: 04 49 PM    Dictation Date and Time:11/14/2013 16:49:05  Transcription Date and Time:11/14/2013 17:37:14  eScription Dictation id: [id number] Confirmation # :[id number]

## 2013-11-14 NOTE — Progress Notes (Signed)
Date of Service: 11/14/2013    CHIEF COMPLAINT:  Left knee pain.    DIAGNOSIS:  Torn lateral meniscus.    HISTORY OF PRESENT ILLNESS:  Craig Peterson is a former patient of ours.  He was seen for a left acromioclavicular joint separation.  His shoulders are doing fine right now.  He is here because of left leg sciatic pain, and some complaints of weakness.  He explains that his left leg has difficulty keeping up with his right leg when he is running.  He feels that his quadriceps are weak, and his ability to dorsiflex his foot also is weak, and has been noted by Craig Peterson, who also has seen and evaluated the patient, and ordered at the current MRI.  His specific complaint of knee pain is quite vague.  It is painful posteriorly only when he fully extends his knee.  He feels that it is a tightness and painfulness there  behind his knee with that maneuver.  His initial injury appears to have occurred while he was playing soccer and hyperextended his left knee.  He does have a very prominent pretibial area that has been seen on MRI and diagnosed as Osgood-Schlatter's disease.  He says that has been there since his younger days and is not painful.    PHYSICAL EXAMINATION:  The patient is a well-developed, well-nourished 53 year old who appears younger than his years.  He is very athletic; his weight is listed as 147 pounds.  He is alert, oriented, and very cooperative.  His native language is [place], but his [place] is quite good.  CARDIOVASCULAR:  Regular rate and rhythm.  EXTREMITIES:  The left knee has a pretibial prominence that is not tender.  There is no instability.  There is no evident effusion.  There is no tenderness over the medial and lateral joint line.  There is a negative McMurray's.  There is a negative Bounce test.  There is no crepitus with passive range of motion.  There is a negative straight leg raise.  However, the patient reports pain behind the knee with full extension.  The patient is able to  toe stand and toe walk.  He has difficulty heel walking because his left foot feels weak, and has difficulty maintaining his left foot off the floor, yet he is able to do this noticeably with less balance and strength than with the right.    MRI that was done on October 22, 2013, impression reads as follows:  1.  Torn lateral meniscus.  Tiny medial meniscus tear questioned on a single image, possibly an artifact.  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  3.  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  4.  Chondromalacia.  5.  Small joint effusion.    ASSESSMENT AND PLAN:  Craig Peterson is 53 years old with a 4-43-month history of left knee pain that began last fall of 2014 while playing soccer.  His exam is significant only for weakness with dorsiflexion of the left foot, and reported difficulty with running, where his left leg does not keep up with his right.    PLAN:  The patient has not done physical therapy.  He is scheduled to start this next week.  We agree that the patient should begin physical therapy for strengthening of the left lower extremity despite the observed torn lateral meniscus, the patient does not report intermittent swelling or catching of his knee.  In addition to the physical therapy, he will begin taking 600 mg of ibuprofen 3  times a day with food.  He will follow up in 4-5 weeks to evaluate his progress.  He does not appear to be a surgical candidate at this time.    The patient was seen and examined by Dr. Cheron EveryLipman who will dictate an attestation.    ___________________________  Reviewed and Electronically Signed By: Craig DomMICHAEL E Onisha Cedeno PA-C  Sig Date: 11/15/2013  Sig Time: 06:57:52  Dictated By: Craig DomMICHAEL E Dacoda Spallone PA-C  Dict Date: 11/14/2013 Dict Time: 05 02 PM    Dictation Date and Time:11/14/2013 17:02:47  Transcription Date and Time:11/14/2013 17:55:33  eScription Dictation id: [id number] Confirmation # :[id number]

## 2013-11-26 ENCOUNTER — Ambulatory Visit (HOSPITAL_BASED_OUTPATIENT_CLINIC_OR_DEPARTMENT_OTHER): Payer: No Typology Code available for payment source

## 2013-12-04 ENCOUNTER — Ambulatory Visit (HOSPITAL_BASED_OUTPATIENT_CLINIC_OR_DEPARTMENT_OTHER): Payer: No Typology Code available for payment source | Admitting: Otolaryngology

## 2013-12-06 ENCOUNTER — Ambulatory Visit (HOSPITAL_BASED_OUTPATIENT_CLINIC_OR_DEPARTMENT_OTHER): Payer: No Typology Code available for payment source | Admitting: Rehabilitative and Restorative Service Providers"

## 2013-12-06 DIAGNOSIS — M25562 Pain in left knee: Secondary | ICD-10-CM

## 2013-12-06 DIAGNOSIS — M25569 Pain in unspecified knee: Secondary | ICD-10-CM | POA: Insufficient documentation

## 2013-12-06 NOTE — Progress Notes (Signed)
Outpatient Physical Therapy Initial Evaluation  [facility]: [facility]    Boss Addison LankG Schult is a 53 year old male who presents to [facility] with complaints of posterior left knee pain, sudden onset sixteen weeks ago when he hyperextended the knee while playing soccer. Pain has been decreasing since onset.    Patient learns best by demonstration.    Imaging:  MRI L knee 10/22/13  1. Torn lateral meniscus. Tiny medial meniscus tear questioned on a single image, possibly an artifact.   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   3. 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   4. Chondromalacia.   5. Small joint effusion.      Pain:  Today:0/10  Best:0/10  Worst:5/10    Provocative Factors: extending knee    Alleviating Factors: most of the time pain-free    Descriptor: pulling    Location: popliteal fossa      Neuro:  Paraesthesia? Not anymore- per patient had +n/t lateral lower leg after injury, has resolved   Bowel/Bladder Changes? no      Function  ADLs:  Difficulty doffing shoe due to perceived ankle weakness    Driving:  WNL    Stairs:  WNL    Work/School Duties:  Works full time in [employer], no problems    Sleeping:  WNL    Recreational Activities:  Has not returned to any activities- soccer, running, gym; since injury.    Patient Active Problem List:  Patient Active Problem List:     Mallet finger     Family history of diabetes mellitus     Systolic murmur     Vitiligo     Acromioclavicular joint separation, type 2     Shoulder pain, left     Globus sensation, possibly secondary to GERD     Sciatica of left side vs knee pain     Knee pain      Medications:    Current Outpatient Prescriptions on File Prior to Visit:  ibuprofen (ADVIL,MOTRIN) 600 MG tablet Take 1 tablet by mouth every 8 (eight) hours. Disp: 90 tablet Rfl: 2   omeprazole (PRILOSEC) 20 MG capsule Take 1 capsule by mouth 2 (two) times daily. Disp: 60 capsule Rfl: 3     No current facility-administered  medications on file prior to visit.       12/06/13 1500   Language Information   Language of Care English   Interpreter No   Rehab Discipline   Rehab Discipline PT   Visit   Visit number 1   POC Due date 01/03/14   Pain   Pain Score 0    Precautions   Precautions No   Posture   Posture assessment WFL   LLE Assessment   LLE Assessment X   PROM LLE (degrees)   Overall PROM WNL   Strength LLE   L Hip Flexion 5/5   L Hip Extension 4+/5   L Hip ABduction 5/5   L Knee Flexion 4+/5   L Knee Extension 4/5   L Ankle Dorsiflexion 4+/5   L Ankle Plantar Flexion 5/5   L Ankle Inversion 5/5   L Ankle Eversion 4+/5   LLE Muscle Length Assessment   Muscle length (comment) WNL   RLE Assessment   RLE Assessment X   PROM RLE (degrees)   Overall PROM WNL   Strength RLE   R Hip Flexion 5/5   R Hip Extension 4+/5   R Hip ABduction 4+/5  R Knee Flexion 5/5   R Knee Extension 5/5   R Ankle Dorsiflexion 5/5   R Ankle Plantar Flexion 5/5   R Ankle Inversion 5/5   R Ankle Eversion 5/5   RLE Muscle Length Assessment   Muscle length (comment) WNL   Clinical Special Tests   Special Tests Yes   Lumbar/Sacroiliac results   Other (comments) Positve  (Bowstring L)   Sensation   Light Touch No apparent deficits   Functional Activities/Ergonomics   Single Limb Stance WNL   Palpation   Tenderness to Palpation popliteal fossa just medial to lateral hamstring tendons (sciatic nerve)   Left Knee Patellofemoral   L Patellofemoral Superior 2/6   L Patellofemoral Inferior 2/6   L Patellofemoral Medial 3/6   L Patellofemoral Lateral 1/6   Reflexes   RLE Reflexes within normal limits (2+)   LLE Reflexes within normal limits (2+)   Patient Education   What was taught? PT role, PT POC   Method Verbal   Patient comprehension Yes       Physical Therapy Plan of Care    MD:  Greer Ee, MD    Referring Provider:   Cristine Polio  [address]  [place], [place] [address]    Diagnosis:   Primary Diagnosis: Knee pain, left [719.46]    Assessment/Objective Findings:    DERMOT GREMILLION is a 53 year old year old male with complaints of pain in his left knee .     Pt reports onset of pain about sixteen weeks ago due to hyperextending the knee while playing soccer. Pt's current occupation is working in [employer], with baseline physical activities including playing soccer, running. Pt expresses long term goal of returning to all activities, is motivated to work towards this in PT.    Clinical presentation today is consistent with nerve stretch injury and pt will benefit from skilled PT focused on strengthening and stabilization to address the following problems and impairments noted upon evaluation: neural tension, focal weakness of the LLE, and a feeling of the left leg "not being able to keep up" with ambulation.    These problems limit the patient with the following functional activities: doffing shoes, returning to recreational activities. The prescribed treatment plan of care is medically necessary to address afore mentioned impairments and return to maximal level of function.    Short Term Functional Goals: 2 weeks.   Patient will report minimal percieved weakness with ambulation    Long Term Goal: 4 weeks.   Patient will return to running and playing soccer without pain, numbness  Patient will report no problems with ADL's such as doffing shoes.  Patient will be independent with HEP    Treatment Plan:  ** Stretching/ROM Exercise  ** Therapeutic Exercise  ** Home Exercise Program  ** Joint Mobilization  ** Soft Tissue Mobilization  ** Hot/Cold Rx  ** Gait Training  ** Functional Activities  ** Patient Education    Recommend Physical Therapy be continued 2 times per week for 4 weeks.  The rehabilitation potential for this patient is good    Patient Meryle Ready is aware of attendance policy: Yes  Plan of care discussed with Patient/Family: Yes  Patient goals reviewed and incorporated in plan of care: Yes  Patient/Family agrees with plan of care: Yes  Patient/Family education:  Yes  Does patient feel safe at home: Yes      Thank you for your referral. Please feel free to contact me with any questions or  concerns.      Coolidge Breeze, PT, DPT  Staff Physical Therapist  [place]: [facility]  [email or web address]  Phone: [phone]  Fax: [phone]

## 2013-12-06 NOTE — Progress Notes (Signed)
PLAN:   12/06/13 1500   Language Information   Language of Care English   Interpreter No   Precautions   Precautions No   Rehab Discipline   Rehab Discipline PT   Visit   Visit number 1   POC Due date 01/03/14   Pain   Pain Score 0    Manual Therapy   Manual Therapy Yes   Technique STM prox gastroc, distal HS   Manual Therapy 2   Technique PFJ mobs   Ther Exercise   Therapeutic Exercise? Yes   Exercise Warm up   Ther Exercise 2   Exercise Quad strengthening   Patient Education   What was taught? PT role, PT POC   Method Verbal   Patient comprehension Yes

## 2013-12-07 ENCOUNTER — Ambulatory Visit (HOSPITAL_BASED_OUTPATIENT_CLINIC_OR_DEPARTMENT_OTHER): Payer: No Typology Code available for payment source | Admitting: Otolaryngology

## 2013-12-07 NOTE — Progress Notes (Signed)
I documented that treatment plan is reasonable and necessary.  Thanks.

## 2013-12-11 ENCOUNTER — Ambulatory Visit (HOSPITAL_BASED_OUTPATIENT_CLINIC_OR_DEPARTMENT_OTHER): Payer: Self-pay | Admitting: Internal Medicine

## 2013-12-11 DIAGNOSIS — Z0289 Encounter for other administrative examinations: Secondary | ICD-10-CM

## 2013-12-12 ENCOUNTER — Ambulatory Visit (HOSPITAL_BASED_OUTPATIENT_CLINIC_OR_DEPARTMENT_OTHER): Payer: Self-pay | Admitting: Rehabilitative and Restorative Service Providers"

## 2013-12-12 ENCOUNTER — Telehealth (HOSPITAL_BASED_OUTPATIENT_CLINIC_OR_DEPARTMENT_OTHER): Payer: Self-pay | Admitting: Rehabilitative and Restorative Service Providers"

## 2013-12-12 NOTE — Progress Notes (Signed)
Called patient regarding today's missed appointment. Left message reminding patient of upcoming appointment on Wednesday 12/19/13 at 5:30pm. Department phone number left for cancellations.    Joya SanJillian Keilin Gamboa, PT, DPT

## 2013-12-19 ENCOUNTER — Ambulatory Visit (HOSPITAL_BASED_OUTPATIENT_CLINIC_OR_DEPARTMENT_OTHER): Payer: No Typology Code available for payment source | Admitting: Rehabilitative and Restorative Service Providers"

## 2013-12-19 DIAGNOSIS — M25562 Pain in left knee: Secondary | ICD-10-CM

## 2013-12-20 NOTE — Progress Notes (Signed)
12/19/13 1700   Language Information   Language of Care English   Interpreter No   Precautions   Precautions No   Rehab Discipline   Rehab Discipline PT   Visit   Visit number 2   POC Due date 01/03/14   Time Calculation   Start Time 1720   Pain   Pain Score 7   (pain increase may due to HEP)   Manual Therapy   Manual Therapy Yes   Technique STM prox gastroc, distal HS   Manual Therapy 2   Technique Tibiofemoral joint mob seated with anterior and posterior glides   Ther Exercise   Therapeutic Exercise? Yes   Exercise Bike x 13 mins L3   Ther Exercise 2   Exercise Eccentric hamstrings strengthening with manual resistance   Reps 2 10   Sets 2 1   Ther Exercise 3   Exercise 3 SLR's;  bridging   Reps 3 10  (each exercise)   Sets 3 1   Ther Exercise 4   Exercise 4 Bridging with knees extended feet on RPB   Reps 4 10   Sets 4 1   Ther Exercise 5   Exercise 5 hamstrings curls with bridging on RPB    Reps 5 10   Sets 5 1   Patient Education   What was taught? Instructed in SLR, bridging   Method Verbal;Practice;Written   Patient comprehension Yes

## 2013-12-20 NOTE — Progress Notes (Signed)
S:  Patient reports ?increased pain due to exercises.    O: Refer to Rehabilitation Flow Sheet for details.  A:  Palpation reveals audible "pop" medial gastroc with eccentric contraction of hamstrings at end range.  Tolerated treatment well.  P:  Continue with skilled PT services.

## 2013-12-24 ENCOUNTER — Ambulatory Visit (HOSPITAL_BASED_OUTPATIENT_CLINIC_OR_DEPARTMENT_OTHER): Payer: No Typology Code available for payment source | Admitting: Rehabilitative and Restorative Service Providers"

## 2013-12-24 DIAGNOSIS — M25562 Pain in left knee: Secondary | ICD-10-CM

## 2013-12-24 NOTE — Progress Notes (Signed)
S:  Patient reports he has pain to 1/10 in the middle of the back of his knee (popiteal fossa.)    O: Refer to Rehabilitation Flow Sheet for details.  A:  Noted continues with palpable "pop" semitendinous tendon with eccentric hamstrings strengthening.  Noted improved patellar mobility since eval.  Noted lateral tilt of L patella.  Tolerated treatment well.  P:  Continue with skilled PT services.

## 2013-12-24 NOTE — Progress Notes (Signed)
12/24/13 1700   Language Information   Language of Care English   Interpreter No   Precautions   Precautions No   Rehab Discipline   Rehab Discipline PT   Visit   Visit number 3   POC Due date 01/03/14   Time Calculation   Start Time 1700   Stop Time 1740   Time Calculation (min) 40 min   Pain   Pain Score 0    Manual Therapy   Manual Therapy Yes   Technique STM prox gastroc, distal HS   Manual Therapy 2   Technique Tibiofemoral joint mob seated with anterior and posterior glides   Manual Therapy 3   Technique Patellar mob all directions   Ther Exercise   Therapeutic Exercise? Yes   Exercise Bike x 10 mins L6  (SLS on aerex w/o UE support 60 sec)   Ther Exercise 2   Exercise Eccentric hamstrings strengthening with manual resistance   Reps 2 10   Sets 2 1   Ther Exercise 3   Exercise 3 SLR's with 1 1/2#'s;  bridging; SLR's with ER L   Reps 3 10  (each exercise)   Sets 3 1   Ther Exercise 4   Exercise 4 Bridging with knees extended feet on RPB   Reps 4 10   Sets 4 1   Ther Exercise 5   Exercise 5 hamstrings curls with bridging on RPB    Reps 5 10   Sets 5 1   Patient Education   What was taught? Instructed in SLR with ER, continue HEP   Method Verbal;Practice;Written   Patient comprehension Yes

## 2013-12-31 ENCOUNTER — Ambulatory Visit (HOSPITAL_BASED_OUTPATIENT_CLINIC_OR_DEPARTMENT_OTHER): Payer: No Typology Code available for payment source | Admitting: Rehabilitative and Restorative Service Providers"

## 2013-12-31 DIAGNOSIS — M25562 Pain in left knee: Secondary | ICD-10-CM

## 2013-12-31 NOTE — Progress Notes (Signed)
12/31/13 1628   Language Information   Language of Care English   Interpreter No   Precautions   Precautions No   Rehab Discipline   Rehab Discipline PT   Visit   Visit number 4   POC Due date 01/03/14   Time Calculation   Start Time 1628   Pain   Pain Score 0    Manual Therapy 3   Technique Patellar mob all directions   Ther Exercise   Therapeutic Exercise? Yes   Exercise Bike x 10 mins L6; Seat L8   Ther Exercise 2   Exercise Stretches: Gastroc off step, HS with strap,    Holds 2 30"   Ther Exercise 3   Exercise 3 Stationary lunges   Reps 3 10   Sets 3 2   Ther Exercise 4   Exercise 4 Bridges to HS curls with LE on RPB   Reps 4 10   Sets 4 2   Ther Exercise 5   Exercise 5 Lateral walks RTB   Reps 5 10   Sets 5 2   Ther Exercise 6   Exercise 6 Wall squats    Reps 6 10   Sets 6 2   Holds 6 5"   Ther Exercise 7   Exercise 7 Heel raises off steps   Reps 7 10   Sets 7 2   Patient Education   What was taught? HEP   Method Verbal;Demo;Practice;Written   Patient comprehension Yes

## 2013-12-31 NOTE — Progress Notes (Signed)
S: Pt reports having no pain today. Reports he would like to go back to soccer - has not played since hurt knee. Reports compliance with HEP. No adverse effects from last rx.   O: Refer to Rehabilitation Treatment Flowsheet. Provided HEP including wall squats, lateral walks, HS stretch, quad stretch, and hip flexor stretch.   A: Demo decreased muscle endurance during some exercises. Tight HS, hip flexor, and quad muscles. Demo improved patella mob all direction - similar to opp side. Demo good understanding of exercises.Tolerated treatment well.   P:Continue with current plan of care. Progress as tolerated. Trial jogging on TM for few minutes next.

## 2014-01-02 ENCOUNTER — Ambulatory Visit (HOSPITAL_BASED_OUTPATIENT_CLINIC_OR_DEPARTMENT_OTHER): Payer: No Typology Code available for payment source | Admitting: Rehabilitative and Restorative Service Providers"

## 2014-01-02 NOTE — Progress Notes (Signed)
This encounter was opened in error.  Please disregard.

## 2014-01-04 ENCOUNTER — Ambulatory Visit (HOSPITAL_BASED_OUTPATIENT_CLINIC_OR_DEPARTMENT_OTHER): Payer: No Typology Code available for payment source | Admitting: Otolaryngology

## 2014-01-07 ENCOUNTER — Ambulatory Visit (HOSPITAL_BASED_OUTPATIENT_CLINIC_OR_DEPARTMENT_OTHER): Payer: No Typology Code available for payment source | Admitting: Rehabilitative and Restorative Service Providers"

## 2014-01-07 DIAGNOSIS — M25562 Pain in left knee: Secondary | ICD-10-CM

## 2014-01-07 NOTE — Progress Notes (Signed)
PHYSICAL THERAPY DISCHARGE REPORT      DIAGNOSIS:   The encounter diagnosis was Knee pain, left.      MD:   Cristine Polio  [address]  [place], [place] [address]      Your patient, Craig Peterson, has been discharged from Physical Therapy after a total of 5 visits.    Pain:  Today:0/10  Best:0/10  Worst:0/10      Neuro:  Paraesthesia? Not anymore- per patient had +n/t lateral lower leg after injury, has resolved   Bowel/Bladder Changes? no      Function  ADLs:  WNL    Recreational Activities:  Has not returned to any activities- soccer, running, gym; since injury; plans to do so soon    Patient Active Problem List:  Patient Active Problem List:     Mallet finger     Family history of diabetes mellitus     Systolic murmur     Vitiligo     Acromioclavicular joint separation, type 2     Shoulder pain, left     Globus sensation, possibly secondary to GERD     Sciatica of left side vs knee pain     Knee pain      Medications:    Current Outpatient Prescriptions on File Prior to Visit:  omeprazole (PRILOSEC) 20 MG capsule Take 1 capsule by mouth 2 (two) times daily. Disp: 60 capsule Rfl: 3     No current facility-administered medications on file prior to visit.       01/07/14 1700   Language Information   Language of Care English   Interpreter No   Evaluation Type   Evaluation Type Re-evaluation   Rehab Discipline   Rehab Discipline PT   Visit   Visit number 5   POC Due date DC today   Pain   Pain Score 0    Precautions   Precautions No   Posture   Posture assessment WFL   LLE Assessment   LLE Assessment X   PROM LLE (degrees)   Overall PROM WNL   Strength LLE   L Knee Flexion 4+/5   L Knee Extension 5/5   L Ankle Dorsiflexion 5/5   L Ankle Plantar Flexion 5/5   L Ankle Inversion 5/5   L Ankle Eversion 5/5   LLE Muscle Length Assessment   Muscle length (comment) WNL   RLE Assessment   RLE Assessment X   PROM RLE (degrees)   Overall PROM WNL   Strength RLE   R Knee Flexion 5/5   R Knee Extension 5/5   R Ankle Dorsiflexion  5/5   R Ankle Plantar Flexion 5/5   R Ankle Inversion 5/5   R Ankle Eversion 5/5   RLE Muscle Length Assessment   Muscle length (comment) WNL   Clinical Special Tests   Special Tests Yes   Lumbar/Sacroiliac results   Other (comments) Negative  (Bowstring L)   Functional Activities/Ergonomics   Single Limb Stance WNL   Palpation   Tenderness to Palpation none   Patient Education   What was taught? discharge planning, continue HEP for hamstring strength   Method Verbal;Practice   Patient comprehension Yes           REASONS FOR DISCHARGE:  This patient has reached all functional goals, is pain-free, and is independent with HEP      RECOMMENDATIONS:  This patient should continue his HEP      If you have any questions please  feel free to contact me by email or phone.      Thank you,    Joya SanJillian Alexande Sheerin, PT, DPT  [email or web address]  [phone]

## 2014-01-07 NOTE — Progress Notes (Signed)
01/07/14 1700   Language Information   Language of Care English   Interpreter No   Precautions   Precautions No   Rehab Discipline   Rehab Discipline PT   Visit   Visit number 5   POC Due date DC today   Time Calculation   Start Time 1730   Stop Time 1745   Time Calculation (min) 15 min   Pain   Pain Score 0    Patient Education   What was taught? discharge planning, continue HEP for hamstring strength   Method Verbal;Practice   Patient comprehension Yes

## 2015-07-30 ENCOUNTER — Other Ambulatory Visit (HOSPITAL_BASED_OUTPATIENT_CLINIC_OR_DEPARTMENT_OTHER): Payer: Self-pay

## 2015-07-30 NOTE — Telephone Encounter (Signed)
Due for PE, IFOB, Health Maintenance.  Called and spoke with patient, he said that he has a high deductible on his insurance and is still paying the deductible for when he was last seen. Will call to schedule appointment when he finish paying.

## 2015-10-13 ENCOUNTER — Telehealth (HOSPITAL_BASED_OUTPATIENT_CLINIC_OR_DEPARTMENT_OTHER): Payer: Self-pay | Admitting: Internal Medicine

## 2015-10-13 ENCOUNTER — Encounter (HOSPITAL_BASED_OUTPATIENT_CLINIC_OR_DEPARTMENT_OTHER): Payer: Self-pay | Admitting: Internal Medicine

## 2015-10-13 ENCOUNTER — Ambulatory Visit (HOSPITAL_BASED_OUTPATIENT_CLINIC_OR_DEPARTMENT_OTHER): Payer: Medicaid Other | Admitting: Internal Medicine

## 2015-10-13 VITALS — BP 104/66 | HR 60 | Temp 98.1°F | Wt 150.0 lb

## 2015-10-13 DIAGNOSIS — M25511 Pain in right shoulder: Secondary | ICD-10-CM

## 2015-10-13 DIAGNOSIS — K644 Residual hemorrhoidal skin tags: Secondary | ICD-10-CM | POA: Insufficient documentation

## 2015-10-13 DIAGNOSIS — Z23 Encounter for immunization: Secondary | ICD-10-CM

## 2015-10-13 DIAGNOSIS — G8929 Other chronic pain: Secondary | ICD-10-CM

## 2015-10-13 DIAGNOSIS — K625 Hemorrhage of anus and rectum: Secondary | ICD-10-CM

## 2015-10-13 DIAGNOSIS — M5432 Sciatica, left side: Secondary | ICD-10-CM

## 2015-10-13 DIAGNOSIS — Z1211 Encounter for screening for malignant neoplasm of colon: Secondary | ICD-10-CM

## 2015-10-13 DIAGNOSIS — Z0000 Encounter for general adult medical examination without abnormal findings: Secondary | ICD-10-CM

## 2015-10-13 MED ORDER — NAPROXEN 500 MG PO TABS
500.0000 mg | ORAL_TABLET | Freq: Two times a day (BID) | ORAL | 0 refills | Status: DC
Start: 2015-10-13 — End: 2017-04-11

## 2015-10-13 NOTE — Progress Notes (Signed)
10/13/2015  VIS given prior to administration and reviewed with the patient and or legal guardian. Patient understands the disease and the vaccine. See immunization/Injection module or chart review for date of publication and additional information.  Ayn Domangue, RN
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
## 2015-10-13 NOTE — Addendum Note (Signed)
Addended by: Windell MouldingSTEPHEN, Marcene Laskowski on: 10/13/2015 04:10 PM     Modules accepted: Orders, SmartSet

## 2015-10-13 NOTE — Progress Notes (Signed)
10/13/2015  VIS given prior to administration and reviewed with the patient and or legal guardian. Patient understands the disease and the vaccine. See immunization/Injection module or chart review for date of publication and additional information.  Latressa Harries, RN

## 2015-10-13 NOTE — Progress Notes (Unsigned)
Katio from [facility] called the Central Refill Department to complete a benefit analysis for TDAP.    The vaccine is covered under the patients prescription coverage.    Please choose [phone] (Prior Auth/Pharmacy) and notify Central Refill via ccd chart once the vaccine has been administered.

## 2015-10-13 NOTE — Addendum Note (Signed)
Addended by: Windell MouldingSTEPHEN, Rylin Seavey on: 10/13/2015 03:59 PM     Modules accepted: Orders, SmartSet

## 2015-10-14 ENCOUNTER — Encounter (HOSPITAL_BASED_OUTPATIENT_CLINIC_OR_DEPARTMENT_OTHER): Payer: Self-pay

## 2015-10-14 ENCOUNTER — Other Ambulatory Visit (HOSPITAL_BASED_OUTPATIENT_CLINIC_OR_DEPARTMENT_OTHER): Payer: Self-pay

## 2015-10-14 DIAGNOSIS — Z1211 Encounter for screening for malignant neoplasm of colon: Secondary | ICD-10-CM

## 2015-10-14 MED ORDER — PEG 3350-KCL-NA BICARB-NACL 420 G PO SOLR
ORAL | 0 refills | Status: AC
Start: 2015-10-14 — End: 2015-12-13

## 2015-10-14 NOTE — Patient Instructions (Addendum)
Verbal instructions given over phone. Written instructions sent to patient in mail. Script sent to pharmacy.Colonoscopy Standard       Division of Gastroenterology  [facility]    Your Colonoscopy test is scheduled at _[place]____________ [facility]    Day_Jan 6______________     Time to Arrive __12noon_____________            Bonita QuinYou are having a colonoscopy.  This test lets the doctor see the inside your large intestine (also called your colon) using a small camera on a flexible tube.  There are several reasons to have the test:   *Screening for colon cancers and polyps    *To check unexplained changes in bowel habits   *To evaluate abnormal X-ray findings   *To look for bleeding ulcers or other abnormalities of the colon lining                 HOW TO GET READY FOR THE TEST      Your prescription was sent to your pharmacy or given to you.    Please pick up your prescription within 1 week of receiving these instructions.     IF YOU ARE USING NULYTELY OR A DRUG LIKE IT, DO NOT ADD WATER TO THE PRESCRIPTION UNTIL 1 DAY BEFORE THE DAY OF YOUR EXAM.    Your prescription was sent to:  [facility] [address]_______    Call a friend or a family member to make sure you have   someone to take you home after your test.   You must have someone to go home with after the test or we will not be able to give you sedatives!      If you have any questions or worries, or if you are unsure about how to prepare for this test, please call [phone]_____________                                  DAY OF THE TEST    4 to 5 hours before your test, drink the remaining half of the bottle of laxative.    It is very important to finish the WHOLE GALLON.    The morning of the test, you can take your other medications at the usual time with a sip of water. These include blood pressure pills, seizure medications, heart medications, thyroid medications, etc).     Dont take pills for diabetes.  Bring these medicines with you so that you can take them right after your test.      NOTHING to drink for 3 hours before the test.      IMPORTANT INFORMATION About Your Medicines  Based On Recommendations From Experts    IF YOU HAVE ANY CONCERNS ABOUT YOUR MEDICATIONS ASK YOUR OWN DOCTOR. DO NOT WAIT UNTIL THE DAY BEFORE THE TEST!!!!!      Your Medications  You can take your medications for high blood pressure, heart problems, or anxiety with a sip of water at your usual time.  Bring a list of your medications with you.     If you take medicines for Type 2 Diabetes:    One day before the test: If you are taking diabetes pills: Take PILLS in the morning only. If you take morning insulin: take your usual dose.    On the night before the test: Do not take diabetes pills.  If you take insulin for type 2 diabetes, take  your usual long acting insulin.  For example: if you usually take 40 units of Lantus or NPH, take 20 units instead.    On the morning of the test: Do not take diabetes pills. If you are on long-acting insulin for type 2 diabetes, you can take half the dose. For example if you are on 40 units of Lantus or NPH, take 20 units instead.     After the test: You will be allowed to eat normally again. At that time, you should resume taking your diabetes pills at your usual times. If on insulin, take your usual evening dose of insulin. If you cant eat for any reason, check with your doctor.    If you have Type 1 Diabetes:    One day before the test: Take your usual long-acting basal insulin (Lantus or NPH) and make sure your clear liquid diets contain sugar. Check your blood sugars at meal times and cover with short acting insulin only if blood sugar is over 200. Otherwise do not take your short-acting insulin.    On the night before the test: Just take your usual basal insulin dose that you would normally take at that time (for example, your usual full dose of Lantus or NPH).    On the morning of the test: Take  your usual basal insulin dose that you would normally take at that time (for example, your usual full dose of Lantus or NPH).                            Test Checklist    Please use this list to prepare for your test.     7 days before the test (____/____/____)     STOP taking Motrin, Advil, Naprosyn, Aleve or related drugs.   Continue to take all other prescribed meds.   Ask your doctor about whether you should continue Aspirin. It is sometimes        important to stay on this medication.    3 days before the test (____/____/____)     STOP eating any foods that contain beans, seeds, skins, nuts, or are high in fiber.    2 days before the test (____/____/____)     MAKE SURE YOU HAVE A RIDE ARRANGED    EAT dinner no later than 7 pm.  BEGIN a liquid diet. (Do not eat solids. This includes foods such as rice, pasta, bread and fruit).    1 day before the test (____/____/____)     PREPARE the laxative.   START taking the laxative at 6:00pm.   DRINK  the bottle of the laxative.    Day of your test (____/____/____)     FINISH  the rest of the laxative 5 to 6 hours before your test.     DO NOT drink anything 3 hours before the test.      WHAT TO EXPECT ON THE DAY OF YOUR TEST    Colonoscopy  Colonoscopy is a procedure used to see inside the colon and rectum.  During a colonoscopy a flexible tube with a camera and a light is inserted through the rectum. The doctor then examines the large bowel (also called the large intestine or colon).    Colonoscopies can detect inflamed tissue, ulcers and abnormal growths called polyps. Some polyps are cancerous, but most are pre cancerous. These might become dangerous someday. A doctor can usually remove these polyps during the test. They are  then sent to a laboratory to be checked. Polyps are common in adults and are generally harmless. However, most colorectal cancers begin as polyps. Removing them is a good way to prevent cancer. Your doctor may also take biopsies (small pieces of  tissue) for analysis in the lab of abnormalities that they want to check in more detail.    It is extremely important to follow all of the steps for cleaning out your colon. If you do not follow all the steps, the doctor may not be able to see clearly. The exam might need to be cancelled and repeated another day. The clear liquids you can take during the clean out are treated as food by your body, so you wont starve or get dehydrated.        Getting Ready At [facility]  On the day of your test, a nurse and doctor will ask you some more information about your health history.  You will be put into a hospital gown. A small intravenous needle will be inserted in the back of your hand or forearm to give you medications that will make you comfortable during the test.    In the procedure room, you will be asked to lie down in a curled position on your left side.  Please inform the doctor if this is uncomfortable for you. You will be given oxygen to breathe. The test usually lasts 30 minutes to an hour. It may last longer if polyps need to be removed or if other abnormalities are noted.      You will be given medication through the IV in order to control discomfort and help you relax.  You may sleep or be partially awake during the test. Sometimes, you might feel a cramping sensation. We will monitor you and try to make you as comfortable as possible. The tube will be inserted into your rectum (back side) and advanced through the large bowel.  The doctor will try and look at all of the inner walls of your colon. The outer wall and organs outside the colon are not visible with this test.    Possible Complications  Complications are unusual during or after the test but they can happen. The most common risks include colonic perforation (a tear in the colon), bleeding, respiratory problems, blood pressure problems, heart problems, discomfort and adverse reactions to the medications used.  A perforation may result in the need  for emergency surgery and a colostomy bag. Also note, colonoscopy like other medical tests is not perfect. It may not detect problems such as polyps, cancers and other diseases up to 2 to 6% of the time. Luckily, the combined risk of all of these problems is small.    After the Test  You may feel bloated from air which was put into your colon during the test. You may also feel a little drowsy from the medications. You cannot drive or operate heavy machinery or do any important work for the rest of the day. You should plan on resting, watching TV or reading light material after the test. You may forget things that happen during and directly after the test. It is important have someone with you that can remind you of any instructions we give.    Depending on what is found, you may need to have the colonoscopy repeated. Usually, this is done several years later but it may need to be done much sooner. Talk to your doctor about when you should have repeat study  or other testing.    You will usually be at the hospital between 2-3 hours (although sometimes it can take longer).  We will make sure that you are alright before sending you home. You must arrange for someone to drive you home after the test.  Once again, we will not perform the test unless you have an arranged ride. You cannot go home in a taxi or a bus.    Colonoscopy is a safe and effective test that is commonly done at our facilities. You may receive a call to remind you of the date and time of your test. If you have any questions, please feel free to call.     For questions about the test itself, call [phone].    For questions about the date and time of your test, or to change the date or time, call [phone]    For questions regarding your regular medications or health issues, please call your doctor.

## 2015-10-14 NOTE — Telephone Encounter (Signed)
Spoke with patient regarding scheduling colonoscopy. Procedure/preparation/need for ride explained. Assessment completed. Patient scheduled for a colonoscopy on Jan 6 @ 12noon

## 2015-11-07 ENCOUNTER — Encounter (HOSPITAL_BASED_OUTPATIENT_CLINIC_OR_DEPARTMENT_OTHER): Payer: Self-pay | Admitting: Gastroenterology

## 2015-11-07 ENCOUNTER — Ambulatory Visit (HOSPITAL_BASED_OUTPATIENT_CLINIC_OR_DEPARTMENT_OTHER)
Admit: 2015-11-07 | Disposition: A | Discharge status: Home / Self Care | Payer: Self-pay | Source: Ambulatory Visit | Attending: Gastroenterology | Admitting: Gastroenterology

## 2015-11-07 MED ORDER — SODIUM CHLORIDE 0.9 % IV SOLN
INTRAVENOUS | Status: DC
Start: 2015-11-07 — End: 2015-11-07
  Administered 2015-11-07: 11:00:00 via INTRAVENOUS

## 2015-11-07 MED ORDER — FENTANYL CITRATE 0.05 MG/ML IJ SOLN
INTRAMUSCULAR | Status: DC
Start: 2015-11-07 — End: 2015-11-07
  Filled 2015-11-07: qty 4

## 2015-11-07 MED ORDER — MIDAZOLAM HCL 2 MG/2ML IJ SOLN
Freq: Once | INTRAMUSCULAR | Status: AC | PRN
Start: 2015-11-07 — End: 2015-11-07
  Administered 2015-11-07: 2 mg via INTRAVENOUS

## 2015-11-07 MED ORDER — MIDAZOLAM HCL 2 MG/2ML IJ SOLN
Freq: Once | INTRAMUSCULAR | Status: AC | PRN
Start: 2015-11-07 — End: 2015-11-07
  Administered 2015-11-07: 1 mg via INTRAVENOUS

## 2015-11-07 MED ORDER — FENTANYL CITRATE 0.05 MG/ML IJ SOLN
Freq: Once | INTRAMUSCULAR | Status: AC | PRN
Start: 2015-11-07 — End: 2015-11-07
  Administered 2015-11-07: 50 ug via INTRAVENOUS

## 2015-11-07 MED ORDER — MIDAZOLAM HCL 5 MG/5ML IJ SOLN
INTRAMUSCULAR | Status: DC
Start: 2015-11-07 — End: 2015-11-07
  Filled 2015-11-07: qty 10

## 2015-11-07 MED ORDER — DIPHENHYDRAMINE HCL 50 MG/ML IJ SOLN
Freq: Once | INTRAMUSCULAR | Status: AC | PRN
Start: 2015-11-07 — End: 2015-11-07
  Administered 2015-11-07: 25 mg via INTRAVENOUS

## 2015-11-07 MED ORDER — FENTANYL CITRATE 0.05 MG/ML IJ SOLN
Freq: Once | INTRAMUSCULAR | Status: AC | PRN
Start: 2015-11-07 — End: 2015-11-07
  Administered 2015-11-07: 25 ug via INTRAVENOUS

## 2015-11-07 MED ORDER — DIPHENHYDRAMINE HCL 50 MG/ML IJ SOLN
INTRAMUSCULAR | Status: DC
Start: 2015-11-07 — End: 2015-11-07
  Filled 2015-11-07: qty 1

## 2015-11-07 NOTE — Discharge Instructions (Signed)
GI CENTER DISCHARGE INSTRUCTIONS     When you return home, you may feel sleepy. Get plenty of rest for the remainder of the day.     If you received sedation for your procedure DO NOT DRIVE, OPERATE MACHINERY OR MAKE IMPORTANT DECISIONS for the reminder of the day.     It is normal after having a COLONOSCOPY to feel a little gassy and bloated, but if you develop SEVERE ABDOMINAL PAIN call your doctor immediately.     It is normal after having a COLONOSCOPY to see a small amount of blood after your first few bowel movements, but if you see a LARGE AMOUNT OF BRIGHT RED BLOOD, call your doctor immediately.           Call your doctor immediately if you develop:   -  Chest Pain   -  Shortness of breath   -  Difficulty swallowing   -  Vomit bright red blood   -  Notice your bowel movements are black or maroon colored   -  You feel weak and tired     Call your physician for any unusual symptoms.     If for any reason you are unable to reach your doctor go to the nearest         EMERGENCY ROOM.     SPECIFIC INSTRUCTIONS:  ***    Follow-up appointment: ***    Prescription: {yes [id number]}no:[id number]}   Date MD Name: Telephone:   11/07/2015 Marrianne MoodKarim Fawaz ***   Post Procedure Diagnosis:  - Hemorrhoids found on perianal exam.  - Erythematous mucosa in the rectum and in the distal rectum. Biopsied.  Recommendation:  - Await pathology results.  - Return to referring physician.  Marrianne MoodKARIM FAWAZ, MD  11/07/2015 10:37 AM

## 2015-11-07 NOTE — PROVATION-GI (Signed)
[facility]  Patient Name: Craig Peterson  MRN: [id number]  Account Number: [id number]  Gender: Male  Age: 55  Date of Birth: 23-Oct-1961  Admit Type: Outpatient  Patient Location: [facility]  Note Status: Finalized  Referring MD:         Greer EePIETER COHEN, MD  Procedure Date:       11/07/2015 9:09:40 AM  Procedure:            Colonoscopy  Endoscopist:          Marrianne MoodKARIM Advit Trethewey, MD  Indications for Procedure:       Rectal bleeding  Medications:          Midazolam 5 mg IV, Fentanyl 75 micrograms IV,                         Diphenhydramine 25 mg IV  Procedure:       Just prior to the procedure, an updated history and physical was done. I        obtained an informed consent from the patient reviewing the risk of the        procedure including (but not limited to) respiratory depression, perforation,        bleeding, discomfort, a possible need for surgery and unexpected reactions to        medications. The patient is aware that test has limitations and may not        detect significant lesions such as cancer or other potential diseases. The        patient was also informed that they might need a repeat colonoscopy earlier        than standard guidelines if there are changes in their symptoms or concerning        findings noted. A time out was performed with the entire procedure staff        present. The scope was passed under direct vision. Throughout the procedure,        the patient's blood pressure, pulse, and oxygen saturations were monitored        continuously. The [id number] was introduced through the anus and        advanced to the cecum, identified by appendiceal orifice and ileocecal valve.        The scope was then slowly withdrawn with confirmation of the noted findings.        The colonoscopy was performed with difficulty due to significant looping and        a tortuous colon. The patient tolerated the procedure well. The quality of        the bowel preparation was good. The total duration of the procedure  was 16        minutes. Scope withdrawal time was 5 minutes.  Findings:       The perianal exam findings include hemorrhoids.       A localized area of erythematous mucosa was found in the rectum . This was        biopsied with a cold forceps for histology.  Post Procedure Diagnosis:       - Hemorrhoids found on perianal exam.       - Erythematous mucosa in the rectum and in the distal rectum. Biopsied.  Recommendation:       - Await pathology results.       - Return to referring physician.  Marrianne MoodKARIM Caoimhe Damron, MD  11/07/2015 10:37 AM  This report has been signed electronically.  Number of Addenda: 0  Note Initiated On: 11/07/2015 9:09 AM

## 2015-11-07 NOTE — Progress Notes (Signed)
HPI Comments: Patient in bradycardia, rate down to 34; EKG ordered prior to colonoscopy.
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
## 2015-11-07 NOTE — Narrator Note (Signed)
Moderate Sedation    Patient Care Timeline         Sedation: 11/07/2015 0954 to 1024  Sign Off    Time Event   User    09:54:53 Sedation Start colon Craig Filter, RN    09:56:31 Staff Arrived [place], Penni Bombard, RN [Registered Nurse];   [place], Karilyn Cota [Scrub Tech] Craig Filter, RN    09:57:00 [place] Mod Sedation [employer] Scale Details    -  [place] Behavioral Pain Scale (for sedated patients):  0 - Restful.    No facial expression ; Sedation Scale (Modified Ramsey):  5 - Alert   Craig Filter, RN    09:57:00 Devices Testing Template Device Data  -  Pulse:  (!)  44 (Device   Time: 09:57:00) ; SpO2:  100 % (Device Time: 09:57:00) ; BP:  124/78   (Device Time: 09:57:30) ; MAP (mmHg):  93.33 Delvonte Berenson, RN    09:57:00 Other Flowsheet Documentation Other flowsheet entries  -  O2   Flow Rate (L/min):  2 L/min ; O2 Device:  NC ; Cardiac Rhythm:  SB Craig Filter, RN    09:59:00 [place] Mod Sedation [employer] Scale Details    -  [place] Behavioral Pain Scale (for sedated patients):  0 - Restful.    No facial expression ; Sedation Scale (Modified Ramsey):  5 - Alert   Craig Filter, RN    09:59:00 Devices Testing Template Device Data  -  Pulse:  (!)  47 (Device   Time: 09:59:00) ; Resp:  16 (Device Time: 09:59:00) ; SpO2:  99 % (Device   Time: 09:59:00) ; BP:  139/70 (Device Time: 09:59:00) ; MAP (mmHg):  93   Additional Vitals (Critical Care Only)  -  AWRR:  0 (Device Time:   09:59:00) Other flowsheet entries  -  ETCO2 (mmHg):  34 mmHg (Device Time:   09:59:00) Craig Filter, RN    09:59:00 Other Flowsheet Documentation Other flowsheet entries  -    Cardiac Rhythm:  SB Craig Filter, RN    10:01:11 Staff Arrived Marrianne Mood [Surgeon] Craig Filter, RN    10:01:16 Time-Out Time-Out  -  Correct PATIENT?:  Yes ; Correct SIDE,   SITE?MARK visible, if applicable.:  N/A ; Site Marked?:  N/A ; Correct   Side?:  N/A ; Correct Patient Position?:  Yes ; What  Procedure?:  colon ;   Correct PROCEDURE?:  Yes ; Antibiotics Ordered?:  No ; CONSENT, complete   and present?:  Yes ; Pre-Proc. RAD/IMAGING studies available, if ordered?:    N/A ; Pre-Proc. LAB results available, if ordered?:  N/A ; Safety   precautions reviewed?:  Yes Craig Filter, RN    10:01:36 MODERATE SEDATION   Craig Filter, RN    10:01:39 Medication Ordered and Given diphenhydrAMINE (BENADRYL)   injection -  Dose:  25 mg ; Route:  Intravenous Ordered by: Benjaman Pott, RN    10:01:43 Medication Ordered and Given midazolam (VERSED) injection -    Dose:  2 mg ; Route:  Intravenous Ordered by: Benjaman Pott,   RN    10:01:48 Medication Ordered and Given fentaNYL (SUBLIMAZE) injection -    Dose:  50 mcg ; Route:  Intravenous Ordered by: Benjaman Pott, RN    10:01:52 [place]/Ramsey Mod Sedation [place] Behavioral Scale Details    -  [place] Behavioral Pain Scale (for sedated patients):  0 - Restful.  No facial expression ; Sedation Scale (Modified Ramsey):  5 - Alert   Craig FilterKendall Thimothy Barretta, RN    10:02:00 [place] Mod Sedation [employer] Behavioral Scale Details    -  [place] Behavioral Pain Scale (for sedated patients):  0 - Restful.    No facial expression ; Sedation Scale (Modified Ramsey):  5 - Alert   Craig FilterKendall Clerance Umland, RN    10:02:00 Devices Testing Template Device Data  -  Pulse:  (!)  38 (Device   Time: 10:02:00) ; Resp:  13 (Device Time: 10:02:00) ; SpO2:  100 % (Device   Time: 10:02:00) ; BP:  111/88 (Device Time: 10:02:00) ; MAP (mmHg):  95.67   Additional Vitals (Critical Care Only)  -  AWRR:  13 (Device Time:   10:02:00) Other flowsheet entries  -  ETCO2 (mmHg):  38 mmHg (Device Time:   10:02:00) Craig FilterKendall Holleigh Crihfield, RN    10:02:00 Other Flowsheet Documentation Other flowsheet entries  -    Cardiac Rhythm:  SB Craig FilterKendall Nazareth Kirk, RN    10:04:00 [place] Mod Sedation [employer] Behavioral Scale Details    -  [place] Behavioral Pain Scale (for sedated  patients):  0 - Restful.    No facial expression ; Sedation Scale (Modified Ramsey):  5 - Alert   Craig FilterKendall Kalli Greenfield, RN    10:04:00 Devices Testing Template Device Data  -  Pulse:  (!)  47 (Device   Time: 10:04:00) ; Resp:  16 (Device Time: 10:04:00) ; SpO2:  100 % (Device   Time: 10:04:00) ; BP:  113/74 (Device Time: 10:04:30) ; MAP (mmHg):  87   Additional Vitals (Critical Care Only)  -  AWRR:  15 (Device Time:   10:04:00) Other flowsheet entries  -  ETCO2 (mmHg):  37 mmHg (Device Time:   10:04:00) Craig FilterKendall Shariq Puig, RN    10:04:00 Other Flowsheet Documentation Other flowsheet entries  -    Cardiac Rhythm:  SB Craig FilterKendall Dakarai Mcglocklin, RN    10:04:05 Medication Ordered and Given midazolam (VERSED) injection -    Dose:  1 mg ; Route:  Intravenous Ordered by: Benjaman PottKarim Fawaz Shooter Tangen,   RN    10:06:35 Medication Ordered and Given midazolam (VERSED) injection -    Dose:  1 mg ; Route:  Intravenous Ordered by: Benjaman PottKarim Fawaz Ellinore Merced,   RN    10:06:38 [place] Mod Sedation [place] Behavioral Scale Details    -  [place] Behavioral Pain Scale (for sedated patients):  0 - Restful.    No facial expression ; Sedation Scale (Modified Ramsey):  4 - Drowsy   Craig FilterKendall Okie Bogacz, RN    10:07:00 [place] Mod Sedation [employer] Behavioral Scale Details    -  [place] Behavioral Pain Scale (for sedated patients):  0 - Restful.    No facial expression ; Sedation Scale (Modified Ramsey):  4 - Drowsy   Craig FilterKendall Braun Rocca, RN    10:07:00 Devices Testing Template Device Data  -  Pulse:  (!)  49 (Device   Time: 10:07:00) ; Resp:  13 (Device Time: 10:07:00) ; SpO2:  100 % (Device   Time: 10:07:00) ; BP:  109/63 (Device Time: 10:07:30) ; MAP (mmHg):  78.33   Additional Vitals (Critical Care Only)  -  AWRR:  12 (Device Time:   10:07:00) Other flowsheet entries  -  ETCO2 (mmHg):  35 mmHg (Device Time:   10:07:00) Craig FilterKendall Terica Yogi, RN    10:07:00 Other Flowsheet Documentation Other flowsheet entries  -    Cardiac Rhythm:  SB Craig FilterKendall Berneda Piccininni, RN  10:08:12 Medication Ordered and Given midazolam (VERSED) injection -    Dose:  1 mg ; Route:  Intravenous Ordered by: Benjaman Pott,   RN    10:08:15 [place]/Ram[place] Mod Sedation [place] Behavioral Scale Details    -  [place] Behavioral Pain Scale (for sedated patients):  1 - Moaning,   frowning, restless ; Sedation Scale (Modified Ramsey):  4 - Drowsy Craig Filter, RN    10:11:00 [place] Mod Sedation [employer] Scale Details    -  [place] Behavioral Pain Scale (for sedated patients):  0 - Restful.    No facial expression ; Sedation Scale (Modified Ramsey):  2 - Sleeping   [employer], RN    10:11:00 Devices Testing Template Device Data  -  Pulse:  (!)  43 (Device   Time: 10:11:00) ; Resp:  15 (Device Time: 10:11:00) ; SpO2:  100 % (Device   Time: 10:11:00) ; BP:  99/66 (Device Time: 10:11:00) ; MAP (mmHg):  77   Additional Vitals (Critical Care Only)  -  AWRR:  10 (Device Time:   10:11:00) Other flowsheet entries  -  ETCO2 (mmHg):  45 mmHg (Device Time:   10:11:00) Craig Filter, RN    10:11:00 Other Flowsheet Documentation Other flowsheet entries  -    Cardiac Rhythm:  SB Craig Filter, RN    10:12:37 Medication Ordered and Given fentaNYL (SUBLIMAZE) injection -    Dose:  25 mcg ; Route:  Intravenous Ordered by: Benjaman Pott, RN    10:12:40 [place]/Ramsey Mod Sedation [place] Behavioral Scale Details    -  [employer] Behavioral Pain Scale (for sedated patients):  2 - Facial   grimacing, protective body positioning ; Sedation Scale (Modified Ramsey):    3 - Awakens easily Craig Filter, RN    10:13:00 [place] Mod Sedation [employer] Scale Details    -  [employer] Pain Scale (for sedated patients):  2 - Facial   grimacing, protective body positioning ; Sedation Scale (Modified Ramsey):    3 - Awakens easily Craig Filter, RN    10:13:00 Devices Testing Template Device Data  -  Pulse:  (!)  39 (Device   Time: 10:13:00) ; Resp:  14  (Device Time: 10:13:00) ; SpO2:  100 % (Device   Time: 10:13:00) ; BP:  120/68 (Device Time: 10:13:30) ; MAP (mmHg):  85.33   Additional Vitals (Critical Care Only)  -  AWRR:  14 (Device Time:   10:13:00) Other flowsheet entries  -  ETCO2 (mmHg):  38 mmHg (Device Time:   10:13:00) Craig Filter, RN    10:13:00 Other Flowsheet Documentation Other flowsheet entries  -    Cardiac Rhythm:  SB Craig Filter, RN    10:17:00 [place] Mod Sedation [employer] Scale Details    -  [place] Behavioral Pain Scale (for sedated patients):  0 - Restful.    No facial expression ; Sedation Scale (Modified Ramsey):  2 - Sleeping   [employer], RN    10:17:00 Devices Testing Template Device Data  -  Pulse:  (!)  42 (Device   Time: 10:17:00) ; Resp:  15 (Device Time: 10:17:00) ; SpO2:  100 % (Device   Time: 10:17:00) ; BP:  117/76 (Device Time: 10:17:00) ; MAP (mmHg):  89.67   Additional Vitals (Critical Care Only)  -  AWRR:  10 (Device Time:   10:17:00) Other flowsheet entries  -  ETCO2 (mmHg):  39 mmHg (Device Time:  10:17:00) Craig Filter, RN    10:17:00 Other Flowsheet Documentation Other flowsheet entries  -    Cardiac Rhythm:  SB Craig Filter, RN    10:19:00 [place]/Ramsey Mod Sedation [employer] Scale Details    -  [place] Behavioral Pain Scale (for sedated patients):  0 - Restful.    No facial expression ; Sedation Scale (Modified Ramsey):  2 - Sleeping   [employer], RN    10:19:00 Devices Testing Template Device Data  -  Pulse:  (!)  41 (Device   Time: 10:19:00) ; Resp:  12 (Device Time: 10:19:00) ; SpO2:  100 % (Device   Time: 10:19:00) ; BP:  119/71 (Device Time: 10:19:30) ; MAP (mmHg):  87   Additional Vitals (Critical Care Only)  -  AWRR:  13 (Device Time:   10:19:00) Other flowsheet entries  -  ETCO2 (mmHg):  33 mmHg (Device Time:   10:19:00) Craig Filter, RN    10:19:00 Other Flowsheet Documentation Other flowsheet entries  -    Cardiac Rhythm:  SB Craig Filter, RN    10:21:00  Other Flowsheet Documentation Other flowsheet entries  -    Procedure Tolerated:  Well ; Report Called To:  md ; Transferred To:    Recovery area ; Transport Method:  Stretcher Craig Filter, RN    10:22:00 [place]/Ramsey Mod Sedation [employer] Scale Details    -  [employer] Pain Scale (for sedated patients):  0 - Restful.    No facial expression ; Sedation Scale (Modified Ramsey):  2 - Sleeping   [employer], RN    10:22:00 Devices Testing Template Device Data  -  Pulse:  (!)  46 (Device   Time: 10:22:00) ; Resp:  13 (Device Time: 10:22:00) ; SpO2:  100 % (Device   Time: 10:22:00) ; BP:  108/70 (Device Time: 10:22:30) ; MAP (mmHg):  82.67   Additional Vitals (Critical Care Only)  -  AWRR:  13 (Device Time:   10:22:00) Other flowsheet entries  -  ETCO2 (mmHg):  35 mmHg (Device Time:   10:22:00) Craig Filter, RN    10:22:00 Other Flowsheet Documentation Other flowsheet entries  -    Cardiac Rhythm:  SB Craig Filter, RN    10:24:07 Sedation End   Craig Filter, RN

## 2015-11-08 LAB — EKG

## 2015-11-11 LAB — SURGICAL PATH SPECIMEN

## 2015-11-12 ENCOUNTER — Encounter (HOSPITAL_BASED_OUTPATIENT_CLINIC_OR_DEPARTMENT_OTHER): Payer: Self-pay | Admitting: Internal Medicine

## 2015-11-19 LAB — EKG

## 2015-11-20 ENCOUNTER — Encounter (HOSPITAL_BASED_OUTPATIENT_CLINIC_OR_DEPARTMENT_OTHER): Payer: Self-pay | Admitting: Internal Medicine

## 2015-11-20 ENCOUNTER — Ambulatory Visit (HOSPITAL_BASED_OUTPATIENT_CLINIC_OR_DEPARTMENT_OTHER): Payer: Self-pay | Admitting: Internal Medicine

## 2015-11-20 DIAGNOSIS — R9431 Abnormal electrocardiogram [ECG] [EKG]: Secondary | ICD-10-CM | POA: Insufficient documentation

## 2015-11-24 ENCOUNTER — Encounter (HOSPITAL_BASED_OUTPATIENT_CLINIC_OR_DEPARTMENT_OTHER): Payer: Self-pay | Admitting: Gastroenterology

## 2016-04-26 ENCOUNTER — Encounter (HOSPITAL_BASED_OUTPATIENT_CLINIC_OR_DEPARTMENT_OTHER): Payer: Self-pay | Admitting: Internal Medicine

## 2016-04-26 ENCOUNTER — Ambulatory Visit (HOSPITAL_BASED_OUTPATIENT_CLINIC_OR_DEPARTMENT_OTHER): Payer: PRIVATE HEALTH INSURANCE | Admitting: Internal Medicine

## 2016-04-26 VITALS — BP 114/68 | HR 57 | Temp 98.2°F | Wt 152.0 lb

## 2016-04-26 DIAGNOSIS — L80 Vitiligo: Secondary | ICD-10-CM

## 2016-04-26 DIAGNOSIS — M79604 Pain in right leg: Secondary | ICD-10-CM | POA: Insufficient documentation

## 2016-04-26 DIAGNOSIS — J3489 Other specified disorders of nose and nasal sinuses: Secondary | ICD-10-CM

## 2016-04-26 DIAGNOSIS — L989 Disorder of the skin and subcutaneous tissue, unspecified: Secondary | ICD-10-CM | POA: Insufficient documentation

## 2016-04-26 MED ORDER — TRIAMCINOLONE ACETONIDE 0.1 % EX OINT
TOPICAL_OINTMENT | Freq: Two times a day (BID) | CUTANEOUS | 3 refills | Status: AC
Start: 2016-04-26 — End: 2016-07-25

## 2016-04-26 MED ORDER — MUPIROCIN 2 % EX OINT
TOPICAL_OINTMENT | CUTANEOUS | 0 refills | Status: AC
Start: 2016-04-26 — End: 2016-05-26

## 2016-04-26 NOTE — Progress Notes (Signed)
Several issues    right leg problem  He notes that when he's driving especially he has aching pains on his right leg  Has been having the pain intermittently x weeks  Previously had extensive workup for pain on left leg, see problem list  This pain starts behind his right thigh & travels down into his calf  No paresthesias or weakness of his foot  No fevers, chills, weight loss or systemic symptoms     Back lesion  He notes a lesion on his mid back that he thinks has changed recently     Nose problem  Notices irritation, redness of nose every few months  No allergies, rhinitis, sneezing or itching eyes  Has applied petroleum jelly but doesn't help too much     ROS: No fevers or unexplained weight loss. No new headaches. No shortness of breath or chest pain.     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    Works in Forensic scientistconstruction    [place] daughter & grand-daughter        LOW HEP B/HIV risk    pt non-asian    denies H/O multiple sex partners; lifetime partners =1    never treated for STI    denies IVDU    denies being born to mom with HEP B    does not work in Surveyor, quantityhealthcare/public safety        Lives with wife & child       BP 114/68   Pulse 57   Temp 98.2 F (36.8 C) (Oral)   Wt 68.9 kg (152 lb)   SpO2 99%   BMI 22.45 kg/m2  Nasal exam; septum midline, no deformities, nares patent, normal mucosa without swelling, no polyps, no bleeding.   Skin: 2cm hypopigmented circular area on mid back with small dots of hyperpigmented inside  Knee exam: full range of motion, no pain on motion, no effusion, tenderness, masses, ligamentous instability or deformity noted.   Neuro: alert & oriented x 3, no  dysarthria or aphasia, gait wnl   Sensory exam of upper & lower extremities extremities wnl to light touch and pinprick  Motor 5/5 upper & lower extremities equal proximally & distally     ASSESSMENT & PLAN:  (M79.604) Right leg pain  (primary encounter diagnosis)  Comment: he thinks this is sciatica, but hx & exam more consistent with musculoskeletal pain -- nonspecific  Will refer to PT for exercises & stretches  Plan: REFERRAL TO PHYSICAL THERAPY ( INT)            (L98.9) Skin lesion of back  Comment: appears to be vitiligo, he will treat with topical corticosteroids & if does not resolve in 1 month, return for re-evaluation as has a very small <381mm area of hyperpigmented region, that I want to resolve or follow up accordingly  Plan: triamcinolone (KENALOG) 0.1 % ointment            Nose  It appears he has mild recurrent infections on his nose, altho' normal exam today, will use  mupirocin topical (Bactroban) bid when this occurs, if doesn't help, will return when active to show me      The patient was ready to learn and no apparent learning or adherence barriers were identified. I explained the diagnosis and treatment plan, and the patient expressed understanding of the content. I attempted to answer any questions regarding the diagnosis and the proposed treatment.    Possible side effects of the prescribed medication was explained. We discussed the patients current medications.  We discussed the importance of medication compliance. The patient expressed understanding and no barriers to adherence were identified.      he has been advised to call or return with any worsening or new problems

## 2016-05-27 ENCOUNTER — Ambulatory Visit (HOSPITAL_BASED_OUTPATIENT_CLINIC_OR_DEPARTMENT_OTHER): Payer: PRIVATE HEALTH INSURANCE | Admitting: Rehabilitative and Restorative Service Providers"

## 2016-05-27 ENCOUNTER — Encounter (HOSPITAL_BASED_OUTPATIENT_CLINIC_OR_DEPARTMENT_OTHER): Payer: PRIVATE HEALTH INSURANCE | Admitting: Rehabilitative and Restorative Service Providers"

## 2016-05-27 DIAGNOSIS — M79604 Pain in right leg: Secondary | ICD-10-CM

## 2016-05-27 NOTE — Progress Notes (Signed)
Marland KitchenOUTPATIENT EVALUATION    Referring Provider: Greer Ee, MD    Precautions: Old L knee meniscal tear, none for R LE.    SUBJECTIVE  Hx of Present Illness: Pt is a 55 year old male who presents to physical therapy with a physician diagnosis of right leg pain.  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  Pt reports the pain sensation is like a nerve.  Pt reports doing exercises at home and using google to look up 'sciatica' but nothing has helped.   Pt works in [employer] and performs a lot of sitting.      Onset Date: 11/2015  Onset Code: 05  ICD-10 Code: M79.604    Imaging: [facility]  [facility]  [address], [place] [address]  [phone]   Department of Diagnostic Radiology  PATIENT: Craig Peterson, Craig Peterson   DOB: [birth year] AGE: 55  SEX: M   ACCT#: [id number] LOCATION: [facility]   UNIT#: [id number] STATUS: REG REF   ORD PHY: Simmie Davies MD     Exam Date: 10/22/13  Exam Status: Signed     Exam: MRI LT KNEE NON CONTRAST   Reason for Exam: instability, pain s/p soccer injury          Clinical statement: Instability, pain status post soccer injury.     Comparison study: 12/29/2007 radiographs.   Technique: Routine left knee MRI without intra-articular contrast,   using fat-saturated axial proton density, fat-saturated sagittal and   coronal T2-weighted, sagittal proton density, 3D DESS, and coronal   T1-weighted sequences.     Findings:    Motion artifact decreases detail.     There is a complex tear of the lateral meniscus including an   horizontal component involving the posterior horn and body, a vertical   component in the posterior horn, a vertical or radial tear at the   junction of the body and anterior horn, and possibly a small flap   component. In the medial meniscus posterior horn there is and oblique   hyperintensity  extending to the superior surface on a single sagittal   image which could be a subtle tear or a motion artifact.      The cruciate ligaments, quadriceps tendon, patellar retinacula and   lateral collateral ligament are intact. There is edema along the   medial collateral ligament and joint line which could relate to a mild   collateral ligament sprain, with a trace amount of adjacent bursal   fluid.      The distal patellar tendon is thickened with several chronic fragments   redemonstrated within its attachment along the anterior tibial   tubercle consistent with old Osgood-Schlatter's disease. There is mild   associated subcortical edema in the subjacent aspect of the anterior   tibial tubercle which is probably chronic but mild acute traction   injury is not excludable.      There is a 0.9 cm chondral defect along the anterolateral aspect of   the medial femoral condyle with subjacent cortical depression and a   trace amount of subchondral cyst formation consistent with an old   osteochondral lesion. The cartilage defect is predominately   partial-thickness, with a smaller area of high-grade or full thickness   deficit along one edge. There is mild fibrillation of the patellar   cartilage.  Cartilage appears thinned in the medial and lateral   compartments, and there is fissuring of the cartilage along the tibial   plateau most prominent medially. The joint contains a small effusion.     Impression:    1. Torn lateral meniscus. Tiny medial meniscus tear questioned on a   single image, possibly an artifact.   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   3. 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   4. Chondromalacia.   5. Small joint effusion.     <<Signature on File>>   Dictated By:                  Bridgette Habermann MD   Reviewed and Electronically Signed By:  Bridgette Habermann, MD       Technologist: [email or web address]   Signed Date/Time: 10/23/13 0831  Transcribed  Date/Time: 10/23/13 0811 by Oneita Kras  Printed Date/Time:   Report #: [id number]  Addendum Date/Time   CC: Cristine Polio, MD            SUBJECTIVE    Prior Level of Function: I    Pain Level: 6/10    Dominant Extremity:     IADL'S/Work: IMPAIRED: Pt unable to sit d/t R leg pain.    Dressing/Grooming: WNL    Driving/sitting tolerance: IMPAIRED: Pt unable to sit longer than 10 minutes without R thigh and leg pain.    Sleeping: WNL    Aggravating Factors: Sitting.    Alleviating Factors: Nothing of note.    Past Medical History: See Medical Record    Medications: (Rx Comments, concerns): For a list of current medications review the Medication activity.   Mental Status/Communication: WNL    Learns Best: practice, written, demonstration.      Objective:     05/27/16 1500   Language Information   Language of Care English   Evaluation Type   Evaluation Type Initial Evaluation   Rehab Discipline   Rehab Discipline PT   Visit   Visit number 0   Pain   Pain Score 6    Precautions   Precautions No   Patient Stated Goals   Patient stated goals to be able to work and sit without R LE pain   Strengths   Strengths Ability to acquire knowledge;Attitude of self;Previous rehab experience   Posture   Lordosis    Lateral Trunk Shift Left  (only in sitting posture)   Posture assessment X   Spine Assessment   Spine Assessment Lumbar   Lumbar Assessment   Flexion ok   Extension pain   L Rotation ok   R Rotation ok   L Side Bend some mild pain   R Side Bend ok   Strength LLE   L Hip Flexion 5-/5   L Hip Extension 5-/5   L Hip ABduction 5-/5   L Knee Flexion 5-/5   L Knee Extension 5-/5   L Ankle Dorsiflexion 5-/5   LLE Muscle Length Assessment   Muscle length (comment) HS 80 degrees   Strength RLE   R Hip Flexion 5-/5   R Hip Extension 5-/5   R Hip ABduction 5-/5   R Knee Flexion 5-/5   R Knee Extension 5-/5   R Ankle Dorsiflexion 5-/5   RLE Muscle Length Assessment   Muscle length (comment) HS 70 degrees   Clinical Special Tests      Special Tests Yes   Lumbar/Sacroiliac results   L Slump Negative   R Slump Positive  L SLR  Negative   R SLR  Positive   L Faber Negative   R Faber Negative   L Thigh Thrust Negative   R Thigh Thrust Negative   Sensation   Light Touch No apparent deficits   Coordination   Gross Motor Performance Observation WFL   Functional Mobility   Gait symmetrical   Palpation   Tenderness to Palpation PA mobs to L4/L5/S1   Increased Tissue Density R paraspinals over L4/L5/S1   Spine Joint Mobility (0-6 scale)   Spine Mobility assessment  Yes   Lumbar   Passive Accessory Intervertebral Movement (PAIVM) 3/6   Patient Education   What was taught? role of PT, plan of care, HEP (bridging, repeated ext in standing/prone, dead bugs, bird dogs, )   Method Verbal;Practice   Patient comprehension Yes     FUNCTIONAL DEFICITS: Pt unable to sit longer than 10 minutes without R thigh and leg pain.  FUNCTIONAL OUTCOME MEASURES:       Physical Therapy Plan of Care    ZO:XWRUEA Cohen, MD  Referring Provider: Greer Ee, MD  Diagnosis: Right leg pain  (primary encounter diagnosis)    Assessment/Objective Findings: Patient is a 55 year old male with complaints of pain in his right leg and buttock.  The following PT problems were noted upon evaluation: Positive Slump and SLR tests for R LE, R hamstring tightness, increased tissue density and tenderness to palpation over R paraspinals of L4/L5/S1, poor posture with left lateral trunk lean and rounded lower back. Due to pt presentation and examination today, I believe the nature of his R leg pain to be stemming from his spine.  This patient has had back pain in the past and his current posturing in sitting reinforces the likelihood of a referral pattern.  These problems limit the patient with the following functional activities: Pt unable to sit longer than 10 minutes without R thigh and leg pain. This patient will benefit from skilled PT plan of care. The prescribed treatment plan of care is  medically necessary secondary to right leg and buttock pain.  Co-morbidities of Problem List          Mallet finger   Family history of diabetes mellitus   Systolic murmur   Vitiligo   Acromioclavicular joint separation, type 2   Shoulder pain, left   Globus sensation, possibly secondary to GERD   Sciatica of left side vs knee pain   External hemorrhoid, bleeding   Abnormal EKG   Right leg pain   Skin lesion of back      were identified and taken into considerations of plan of care.  In my professional opinion this patient requires skilled physical therapy to address the concerns of the functional limitations outlined above.    Short Term Functional Goals: 4 weeks.   Pt will demonstrate independence and compliance with HEP in 2 weeks  Pt will demonstrate improved ROM of R hamstring by 10 degrees in 4 weeks   Pt will demonstrate improved postural awareness by sitting with biomechanically correct posture > 30 minutes to improve overall postural function in 4 weeks  ks   Pt will demonstrate improved strength of transverse abdominus by ability to hold 30 second plank in 4 weeks     Pt will demonstrate improved tolerance of sitting by 30 minutes prior to onset of R LE pain in 4 weeks      Long Term Goal: 8 weeks.   Pt will demonstrate ability to sit  without R LE pain as required for work in 8 weeks.    Treatment Plan: ** PT Eval - Moderate Complexity (CPT 97162)  ** PT Re-Eval (CPT 97164)  ** Stretching/ROM Exercise [phone])  ** Therapeutic Exercise (CPT 97110)  ** Home Exercise Program (CPT [phone])  ** Neuromuscular Re-education (CPT [phone])  ** Joint Mobilization (CPT 97140)  ** Soft Tissue Mobilization (CPT 97140)  ** Electrical Stimulation/TENS (CPT 97014)  ** Hot/Cold Rx (CPT 97010)  ** Manual Traction (CPT 97140)  ** Gait Training (CPT [id number])  ** Functional Activities (CPT 97530)  ** Patient Education (CPT [phone])    Recommend Physical Therapy be continued 2 times per week for 4 weeks.  The rehabilitation potential for  this patient is good  Based on comprehensive examination and evaluation, this patient meets criteria for MODERATE complexity due to combined factor(s) of musculoskeletal and spinal level examination.    Patient Meryle Ready is aware of attendance policy: Yes  Plan of care discussed with Patient/Family: Yes  Patient goals reviewed and incorporated in plan of care: Yes  Patient/Family agrees with plan of care: Yes  Patient/Family education: Yes  Does patient feel safe at home:  Yes      Myer Haff, PT, DPT, HFS [phone]

## 2016-05-28 NOTE — Progress Notes (Signed)
I certify that the documented Treatment Plan is reasonable and necessary.    05/28/2016  Greer Ee, MD

## 2016-05-31 ENCOUNTER — Ambulatory Visit (HOSPITAL_BASED_OUTPATIENT_CLINIC_OR_DEPARTMENT_OTHER): Payer: PRIVATE HEALTH INSURANCE | Admitting: Rehabilitative and Restorative Service Providers"

## 2016-05-31 DIAGNOSIS — M79604 Pain in right leg: Secondary | ICD-10-CM

## 2016-05-31 NOTE — Progress Notes (Signed)
.  S: Pt reports feeling ok today, 5/10 pain, HEP went ok.  O: Refer to Rehabilitation Treatment Flowsheet     05/31/16 1500   Language Information   Language of Care English   Precautions   Precautions No   Rehab Discipline   Rehab Discipline PT   Visit   Visit number 1   Time Calculation   Start Time 1600   Stop Time 1630   Time Calculation (min) 30 min   Pain   Pain Score 5    Ther Exercise   Exercise Repeated Ext in Prone   Holds 3x10   Ther Exercise 2   Exercise Bird Dogs   Holds 2 3x10   Ther Exercise 3   Exercise 3 Bridging   Holds 3 3x10   Ther Exercise 4   Exercise 4 Dead Bugs   Holds 4 3x10   Ther Exercise 5   Exercise 5 HS Stretch   Holds 5 60 seconds each   Ther Exercise 6   Exercise 6 Swiss Ball Wall Squats   Holds 6 3x10   Ther Exercise 7   Exercise 7 Bike Riding 5 Min   Patient Education   What was taught? role of PT, plan of care, HEP (bridging, repeated ext in standing/prone, dead bugs, bird dogs, )   Method Verbal;Practice   Patient comprehension Yes     A: Pt tolerated therex well this visit, no pain reported.  It is evident that pt is performing HEP from familiarity with therex in PT session today.  Pt is able to handle a relatively high level of exercises, this will be a good prognostic indicator of returning to optimum functioning with PT services.  Pt is in need of continued skilled PT.  P: progress deep abdominal strengthening as pt tolerates.

## 2016-06-07 ENCOUNTER — Ambulatory Visit (HOSPITAL_BASED_OUTPATIENT_CLINIC_OR_DEPARTMENT_OTHER): Payer: PRIVATE HEALTH INSURANCE | Admitting: Rehabilitative and Restorative Service Providers"

## 2016-06-07 DIAGNOSIS — M79604 Pain in right leg: Secondary | ICD-10-CM

## 2016-06-07 NOTE — Progress Notes (Signed)
.  S: Pt reports feeling a little better today, he believes his pain is improving, 4/10 today.  O: Refer to Rehabilitation Treatment Flowsheet     06/07/16 1500   Language Information   Language of Care English   Rehab Discipline   Rehab Discipline PT   Visit   Visit number 2   Time Calculation   Start Time 1600   Stop Time 1630   Time Calculation (min) 30 min   Pain   Pain Score 4    Ther Exercise   Exercise Repeated Ext in Prone   Holds 3x10   Ther Exercise 2   Exercise Bird Dogs   Holds 2 3x10   Ther Exercise 3   Exercise 3 Bridging   Holds 3 3x10   Ther Exercise 4   Exercise 4 Dead Bugs   Holds 4 2x30   Ther Exercise 5   Exercise 5 HS Stretch   Holds 5 60 seconds each   Ther Exercise 6   Exercise 6 Swiss Ball Wall Squats   Holds 6 3x10   Ther Exercise 7   Exercise 7 Bike Riding 5 Min   Patient Education   What was taught? role of PT, plan of care, HEP (bridging, repeated ext in standing/prone, dead bugs, bird dogs, )   Method Verbal;Practice   Patient comprehension Yes     A: Pt tolerated therex well this visit, no pain reported.  Pt is progressing in his strength and endurance, specifically from the deep abdominals evidenced by increase repetitions during dead bugs without form breakdown.  Pt is in need of continued skilled PT.  P: progress LE and deep abdominal therex as pt tolerates.

## 2016-06-10 ENCOUNTER — Ambulatory Visit (HOSPITAL_BASED_OUTPATIENT_CLINIC_OR_DEPARTMENT_OTHER): Payer: PRIVATE HEALTH INSURANCE | Admitting: Rehabilitative and Restorative Service Providers"

## 2016-06-10 DIAGNOSIS — M79604 Pain in right leg: Secondary | ICD-10-CM

## 2016-06-10 NOTE — Progress Notes (Signed)
.  S: Pt reports feeling ok today, his pain is getting a little better, 3/10 today.  Pt reports still having the pain when he sits, however it takes time for it to onset.  He states that when he drives home from work it feels a less pain than before he started PT.  O: Refer to Rehabilitation Treatment Flowsheet     06/10/16 1600   Language Information   Language of Care English   Rehab Discipline   Rehab Discipline PT   Visit   Visit number 3   Time Calculation   Start Time 1630   Stop Time 1700   Time Calculation (min) 30 min   Pain   Pain Score 3    Ther Exercise   Exercise Repeated Ext in Prone   Holds 3x10   Ther Exercise 2   Exercise Bird Dogs   Holds 2 3x10   Ther Exercise 3   Exercise 3 Bridging   Holds 3 3x10   Ther Exercise 4   Exercise 4 Dead Bugs   Holds 4 2x30   Ther Exercise 5   Exercise 5 HS Stretch   Holds 5 60 seconds each   Ther Exercise 6   Exercise 6 Swiss Ball Wall Squats   Holds 6 3x10   Ther Exercise 7   Exercise 7 TKE at [employer]able Column   Holds 7 12#s x 20   Ther Exercise 8   Exercise 8 Lateral Step Ups   Holds 8 3x10   Ther Exercise 9   Exercise 9 Bike Riding 5 Min   Patient Education   What was taught? role of PT, plan of care, HEP (bridging, repeated ext in standing/prone, dead bugs, bird dogs, )   Method Verbal;Practice   Patient comprehension Yes     A: Pt tolerated therex well this visit, no pain reported.  Pt is progressing in endurance and strength as observed by decreased rest breaks between exercises this visit.  Pt performing HEP and experiencing benefits of PT services with longer time to onset of pain than initially.  Pt is in need of continued skilled PT.  P: progress LE therex as pt tolerates.

## 2016-06-14 ENCOUNTER — Ambulatory Visit (HOSPITAL_BASED_OUTPATIENT_CLINIC_OR_DEPARTMENT_OTHER): Payer: PRIVATE HEALTH INSURANCE | Admitting: Rehabilitative and Restorative Service Providers"

## 2016-06-14 DIAGNOSIS — M79604 Pain in right leg: Secondary | ICD-10-CM

## 2016-06-14 NOTE — Progress Notes (Signed)
.  S: Pt reports feeling good today, 4/10 pain.  O: Refer to Rehabilitation Treatment Flowsheet     06/14/16 1600   Rehab Discipline   Rehab Discipline PT   Visit   Visit number 4   Time Calculation   Start Time 1600   Stop Time 1630   Time Calculation (min) 30 min   Pain   Pain Score 3    Ther Exercise   Exercise Repeated Ext in Prone   Holds 3x10   Ther Exercise 2   Exercise Bird Dogs   Holds 2 3x10   Ther Exercise 3   Exercise 3 Bridging   Holds 3 3x10   Ther Exercise 4   Exercise 4 Dead Bugs   Holds 4 2x30   Ther Exercise 5   Exercise 5 HS Stretch   Holds 5 60 seconds each   Ther Exercise 6   Exercise 6 Swiss Ball Wall Squats   Holds 6 3x10   Ther Exercise 7   Exercise 7 TKE at [employer]   Holds 7 12#s x 20   Ther Exercise 8   Exercise 8 Lateral Step Ups   Holds 8 3x10   Ther Exercise 9   Exercise 9 Bike Riding 5 Min   Patient Education   What was taught? role of PT, plan of care, HEP (bridging, repeated ext in standing/prone, dead bugs, bird dogs, )   Method Verbal;Practice   Patient comprehension Yes     A: Pt tolerated therex well today, no complaints of pain this visit.  Pt is progressing in muscular endurance and strength as evidenced by decreased rest breaks.  Pt is in need of continued skilled PT.  P: progress as pt tolerates.

## 2016-06-17 ENCOUNTER — Ambulatory Visit (HOSPITAL_BASED_OUTPATIENT_CLINIC_OR_DEPARTMENT_OTHER): Payer: PRIVATE HEALTH INSURANCE | Admitting: Rehabilitative and Restorative Service Providers"

## 2016-06-17 DIAGNOSIS — M79604 Pain in right leg: Secondary | ICD-10-CM

## 2016-06-17 NOTE — Progress Notes (Signed)
S: Pt reports having 6/10 pain today because he believes he may have been working too hard yesterday.    O: Refer to Rehabilitation Treatment Flowsheet     06/17/16 1600   Rehab Discipline   Rehab Discipline PT   Visit   Visit number 5   Time Calculation   Start Time 1630   Stop Time 1700   Time Calculation (min) 30 min   Pain   Pain Score 6    Ther Exercise   Exercise Repeated Ext in Prone   Holds 3x10   Ther Exercise 2   Exercise Bird Dogs   Holds 2 3x10   Ther Exercise 3   Exercise 3 Bridging   Holds 3 3x10   Ther Exercise 4   Exercise 4 Dead Bugs   Holds 4 2x30   Ther Exercise 5   Exercise 5 HS Stretch   Holds 5 60 seconds each   Ther Exercise 6   Exercise 6 Swiss Ball Wall Squats   Holds 6 3x10   Ther Exercise 7   Exercise 7 TKE at [employer]   Holds 7 12#s x 20   Ther Exercise 8   Exercise 8 Lateral Step Ups   Holds 8 3x10   Ther Exercise 9   Exercise 9 Bike Riding 5 Min   Patient Education   What was taught? role of PT, plan of care, HEP (bridging, repeated ext in standing/prone, dead bugs, bird dogs, )   Method Verbal;Practice   Patient comprehension Yes     A: Pt tolerated therex well today, no pain reported.  Pt is progressing with LE endurance and pain scores are decreasing slowly but steadily.  Due to the nature of pts work seated his progress will most likely be slower than individuals with mixed sitting/standing occupations.  Pt is in need of continued skilled PT.  P: progress as pt tolerates.

## 2016-06-21 ENCOUNTER — Ambulatory Visit (HOSPITAL_BASED_OUTPATIENT_CLINIC_OR_DEPARTMENT_OTHER): Payer: PRIVATE HEALTH INSURANCE | Admitting: Rehabilitative and Restorative Service Providers"

## 2016-06-21 DIAGNOSIS — M79604 Pain in right leg: Secondary | ICD-10-CM

## 2016-06-21 NOTE — Progress Notes (Signed)
.  S: Pt reports no pain today, but last Friday he felt some pain at 5/10, today 0/10.    O: Refer to Rehabilitation Treatment Flowsheet     06/21/16 1500   Rehab Discipline   Rehab Discipline PT   Visit   Visit number 6   Time Calculation   Start Time 1600   Stop Time 1630   Time Calculation (min) 30 min   Pain   Pain Score 0    Ther Exercise   Exercise Repeated Ext in Prone   Holds 3x10   Ther Exercise 2   Exercise Bird Dogs   Holds 2 3x10   Ther Exercise 3   Exercise 3 Bridging   Holds 3 3x10   Ther Exercise 4   Exercise 4 Dead Bugs   Holds 4 2x30   Ther Exercise 5   Exercise 5 HS Stretch   Holds 5 60 seconds each   Ther Exercise 6   Exercise 6 Swiss Ball Wall Squats   Holds 6 3x10   Ther Exercise 7   Exercise 7 TKE at [employer]   Holds 7 12#s x 20   Ther Exercise 8   Exercise 8 Lateral Step Ups   Holds 8 3x10   Ther Exercise 9   Exercise 9 Bike Riding 5 Min   Patient Education   What was taught? role of PT, plan of care, HEP (bridging, repeated ext in standing/prone, dead bugs, bird dogs, )   Method Verbal;Practice   Patient comprehension Yes     A: Pt tolerated therex well today, no pain reported.  Pt is progressing in LE endurance and pain scores are more consistently decreasing on off days from therapy.  Pt is in need of continued skilled PT.  P: progress LE therex and continue lumbar stabilization as pt tolerates.

## 2016-06-24 ENCOUNTER — Ambulatory Visit (HOSPITAL_BASED_OUTPATIENT_CLINIC_OR_DEPARTMENT_OTHER): Payer: PRIVATE HEALTH INSURANCE | Admitting: Rehabilitative and Restorative Service Providers"

## 2016-06-24 DIAGNOSIS — M79604 Pain in right leg: Secondary | ICD-10-CM

## 2016-06-24 NOTE — Progress Notes (Signed)
.  S: Pt reports no pain today, having a busy day but feels good.  O: Refer to Rehabilitation Treatment Flowsheet     06/24/16 1700   Rehab Discipline   Rehab Discipline PT   Visit   Visit number 7   Time Calculation   Start Time 1700   Stop Time 1730   Time Calculation (min) 30 min   Pain   Pain Score 0    Ther Exercise   Exercise Repeated Ext in Prone   Holds 3x10   Ther Exercise 2   Exercise Bird Dogs   Holds 2 3x10   Ther Exercise 3   Exercise 3 Bridging   Holds 3 3x10   Ther Exercise 4   Exercise 4 Dead Bugs   Holds 4 2x30   Ther Exercise 5   Exercise 5 HS Stretch   Holds 5 60 seconds each   Ther Exercise 6   Exercise 6 Swiss Ball Wall Squats   Holds 6 3x10   Ther Exercise 7   Exercise 7 TKE at [employer]   Holds 7 12#s x 20   Ther Exercise 8   Exercise 8 Lateral Step Ups   Holds 8 3x10   Ther Exercise 9   Exercise 9 Bike Riding 5 Min   Patient Education   What was taught? role of PT, plan of care, HEP (bridging, repeated ext in standing/prone, dead bugs, bird dogs, )   Method Verbal;Practice   Patient comprehension Yes     A: Pt executed therex well this visit, endurance and strength are improving.  Pt pain symptoms are steadily becoming less bothersome and decreasing in frequency.  Pt is in need of continued skilled PT.  P: progress as pt tolerates.

## 2016-06-28 ENCOUNTER — Ambulatory Visit (HOSPITAL_BASED_OUTPATIENT_CLINIC_OR_DEPARTMENT_OTHER): Payer: PRIVATE HEALTH INSURANCE | Admitting: Rehabilitative and Restorative Service Providers"

## 2016-06-28 ENCOUNTER — Encounter (HOSPITAL_BASED_OUTPATIENT_CLINIC_OR_DEPARTMENT_OTHER): Payer: Self-pay | Admitting: Rehabilitative and Restorative Service Providers"

## 2016-06-28 DIAGNOSIS — M79604 Pain in right leg: Secondary | ICD-10-CM

## 2016-06-28 NOTE — Progress Notes (Signed)
.       Physical Therapy Discharge Note    DIAGNOSIS:   R leg pain.    MD:   Gavin Pound, MD    Your patient, Craig Peterson. Peterson, has been discharged from Physical Therapy after a total of 8 visits.      REASONS FOR DISCHARGE:  This patient has met all functional goals and is no longer in pain.        RECOMMENDATIONS:  This patient should follow HEP as outlined and consult PCP if pain returns.      If you have any questions please feel free to contact me by email or phone.      Thank you,      Shalicia Craghead K. Carloyn Manner, PT, DPT, Certified [employer] Fitness Specialist

## 2016-06-28 NOTE — Progress Notes (Signed)
.  S: Pt reports feeling pain in his R hamstring today after playing soccer yesterday, he was behind the ball and went to run and he felt it strain.  No other pain today however.  O: Refer to Rehabilitation Treatment Flowsheet     06/28/16 1600   Rehab Discipline   Rehab Discipline PT   Visit   Visit number 8   Time Calculation   Start Time 1600   Stop Time 1630   Time Calculation (min) 30 min   Pain   Pain Score 0    Ther Exercise   Exercise Repeated Ext in Prone   Holds 3x10   Ther Exercise 2   Exercise Bird Dogs   Holds 2 3x10   Ther Exercise 3   Exercise 3 Bridging   Holds 3 3x10   Ther Exercise 4   Exercise 4 Dead Bugs   Holds 4 2x30   Ther Exercise 5   Exercise 5 HS Stretch   Holds 5 60 seconds each   Ther Exercise 6   Exercise 6 Swiss Ball Wall Squats   Holds 6 3x10   Ther Exercise 7   Exercise 7 TKE at [employer]   Holds 7 12#s x 20   Ther Exercise 8   Exercise 8 Lateral Step Ups   Holds 8 3x10 8"   Ther Exercise 9   Exercise 9 Bike Riding 5 Min   Patient Education   What was taught? role of PT, plan of care, HEP (bridging, repeated ext in standing/prone, dead bugs, bird dogs, )   Method Verbal;Practice   Patient comprehension Yes     A: Pt tolerated therex well this visit, pt had no difficulty with HS during squats or step ups.  Pt is progressing in pain scores and functionality.  Pt is in need of continued skilled PT.  P: progress LE therex as pt tolerates.

## 2017-04-07 ENCOUNTER — Ambulatory Visit (HOSPITAL_BASED_OUTPATIENT_CLINIC_OR_DEPARTMENT_OTHER): Payer: Self-pay | Admitting: Ambulatory Care

## 2017-04-07 NOTE — Telephone Encounter (Signed)
Regarding: Left arm pain,shaky, pain about a 6.  Wants to be seen   ----- Message from Laury DeepMary Gioiosa sent at 04/07/2017 10:58 AM EDT -----  Craig DonningJose G Peterson [phone], 56 year old, male    Calls today:  Sick    What are the symptoms Left arm pain,shaky, pain about a 6.  Comes and goes.  Pain about four times a day.  How long has patient been sick? 1 week   What has pt. tried at home   Person calling on behalf of patient: Patient (self)    CALL BACK NUMBER:[phone]  Best time to call back: today  Cell phone:   Other phone:    Patient's language of care: English    Patient does not need an interpreter.    Patient's PCP: Greer EePieter Cohen, MD

## 2017-04-07 NOTE — Telephone Encounter (Signed)
Spoke with pt.   Sometimes L arm feels vibration sensation- says has been happening for one week off and on. Lasts about 15-20 minutes then it stops. A little bit of pain when it happens. Says hx injury in that arm- do see L shoulder pain problem list.  Happens couple times a day. Has happened couple times today  No neck pain. No chest no SOB.  No numbness or tingling with extremities.  Face normal appearance  Asking to be seen Monday afternoon- scheduled

## 2017-04-11 ENCOUNTER — Ambulatory Visit (HOSPITAL_BASED_OUTPATIENT_CLINIC_OR_DEPARTMENT_OTHER): Payer: PRIVATE HEALTH INSURANCE | Admitting: Internal Medicine

## 2017-04-11 ENCOUNTER — Encounter (HOSPITAL_BASED_OUTPATIENT_CLINIC_OR_DEPARTMENT_OTHER): Payer: Self-pay | Admitting: Internal Medicine

## 2017-04-11 VITALS — BP 106/62 | HR 58 | Temp 97.8°F | Wt 156.0 lb

## 2017-04-11 DIAGNOSIS — Z0000 Encounter for general adult medical examination without abnormal findings: Secondary | ICD-10-CM

## 2017-04-11 DIAGNOSIS — R202 Paresthesia of skin: Secondary | ICD-10-CM
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
## 2017-04-15 ENCOUNTER — Ambulatory Visit (HOSPITAL_BASED_OUTPATIENT_CLINIC_OR_DEPARTMENT_OTHER): Payer: PRIVATE HEALTH INSURANCE | Admitting: Lab

## 2017-04-15 DIAGNOSIS — Z0000 Encounter for general adult medical examination without abnormal findings: Secondary | ICD-10-CM

## 2017-04-15 NOTE — Progress Notes (Signed)
Labs drawn.  Geri Hepler, [place], 04/15/2017, 10:13 AM

## 2017-04-18 ENCOUNTER — Encounter (HOSPITAL_BASED_OUTPATIENT_CLINIC_OR_DEPARTMENT_OTHER): Payer: Self-pay | Admitting: Internal Medicine

## 2017-04-18 LAB — LIPID PANEL
Cholesterol: 202 mg/dL (ref 0–239)
HIGH DENSITY LIPOPROTEIN: 73 mg/dL (ref 40–?)
LOW DENSITY LIPOPROTEIN DIRECT: 116 mg/dL (ref 0–189)
TRIGLYCERIDES: 70 mg/dL (ref 0–150)

## 2017-04-18 LAB — HIV ANTIGEN ANTIBODY: HIV 1/2 PLUS O AG/AB SCREEN: NONREACTIVE

## 2017-11-16 ENCOUNTER — Ambulatory Visit (HOSPITAL_BASED_OUTPATIENT_CLINIC_OR_DEPARTMENT_OTHER): Payer: Self-pay | Admitting: Preventive Medicine/Occupational Environmental Medicine

## 2018-05-29 ENCOUNTER — Ambulatory Visit
Admission: RE | Admit: 2018-05-29 | Discharge: 2018-05-29 | Disposition: A | Discharge status: Home / Self Care | Payer: HMO | Attending: Internal Medicine | Admitting: Internal Medicine

## 2018-05-29 ENCOUNTER — Ambulatory Visit (HOSPITAL_BASED_OUTPATIENT_CLINIC_OR_DEPARTMENT_OTHER): Payer: HMO | Admitting: Internal Medicine

## 2018-05-29 ENCOUNTER — Encounter (HOSPITAL_BASED_OUTPATIENT_CLINIC_OR_DEPARTMENT_OTHER): Payer: Self-pay | Admitting: Internal Medicine

## 2018-05-29 VITALS — BP 130/80 | HR 68 | Wt 155.0 lb

## 2018-05-29 DIAGNOSIS — M4186 Other forms of scoliosis, lumbar region: Secondary | ICD-10-CM | POA: Diagnosis not present

## 2018-05-29 DIAGNOSIS — M545 Low back pain, unspecified: Secondary | ICD-10-CM | POA: Insufficient documentation

## 2018-05-29 DIAGNOSIS — Z0000 Encounter for general adult medical examination without abnormal findings: Secondary | ICD-10-CM | POA: Diagnosis not present

## 2018-05-29 DIAGNOSIS — G8929 Other chronic pain: Secondary | ICD-10-CM

## 2018-05-29 DIAGNOSIS — M25511 Pain in right shoulder: Secondary | ICD-10-CM | POA: Diagnosis not present

## 2018-05-29 DIAGNOSIS — M47816 Spondylosis without myelopathy or radiculopathy, lumbar region: Secondary | ICD-10-CM | POA: Diagnosis not present

## 2018-05-29 DIAGNOSIS — M5136 Other intervertebral disc degeneration, lumbar region: Secondary | ICD-10-CM | POA: Diagnosis not present

## 2018-05-29 DIAGNOSIS — M419 Scoliosis, unspecified: Secondary | ICD-10-CM | POA: Diagnosis not present

## 2018-05-29 HISTORY — DX: Other chronic pain: G89.29

## 2018-05-29 HISTORY — DX: Low back pain, unspecified: M54.50

## 2018-05-29 MED ORDER — NAPROXEN 375 MG PO TABS: 375 mg | tablet | Freq: Two times a day (BID) | ORAL | 0 refills | 0 days | Status: AC

## 2018-05-29 MED ORDER — NAPROXEN 375 MG PO TABS
375.00 mg | ORAL_TABLET | Freq: Two times a day (BID) | ORAL | 0 refills | Status: AC
Start: 2018-05-29 — End: 2018-07-28
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
## 2018-05-30 DIAGNOSIS — M5136 Other intervertebral disc degeneration, lumbar region: Secondary | ICD-10-CM

## 2018-05-30 DIAGNOSIS — M419 Scoliosis, unspecified: Secondary | ICD-10-CM

## 2018-05-30 DIAGNOSIS — M47816 Spondylosis without myelopathy or radiculopathy, lumbar region: Secondary | ICD-10-CM

## 2018-05-31 ENCOUNTER — Telehealth (HOSPITAL_BASED_OUTPATIENT_CLINIC_OR_DEPARTMENT_OTHER): Payer: Self-pay | Admitting: Internal Medicine

## 2018-05-31 NOTE — Progress Notes (Signed)
Hi Barb-     Please call pt to let him know that I received his x-ray tests.    His back x-ray shows wear-and-tear type of problems with his joints - but nothing more serious  See how he's doing in respect to the back pain, musculoskeletal     Thanks, Haydee MonicaPieter

## 2018-06-06 NOTE — Progress Notes (Signed)
Spoke with pt. Reviewed xray results  His pain is improved "in a little pain". [facility]

## 2018-06-07 ENCOUNTER — Ambulatory Visit (HOSPITAL_BASED_OUTPATIENT_CLINIC_OR_DEPARTMENT_OTHER): Payer: Self-pay | Admitting: Rehabilitative and Restorative Service Providers"

## 2018-06-07 NOTE — Progress Notes (Deleted)
OUTPATIENT PHYSICAL THERAPY EVALUATION    Subjective:   Diagnosis: No primary diagnosis found.    *** is referred from *** for physical therapist management of his chronic bilateral low back pain without sciatica and chronic right shoulder pain .    Aggravating Factors: ***  Alleviating Factors: ***    Date of Onset: ***  Pain:  Current:   At worst: ***/10   At best: ***/10  Pre-morbid Functional Level: ***    Function:   ADLs: ***  Occupation: ***  Dressing/Grooming: ***  Driving/Transportation: ***  Sleeping: ***  Household/family: ***    Contraindications/Precautions: ***    PMH: Patient Active Problem List:     Mallet finger     Family history of diabetes mellitus     Systolic murmur     Vitiligo     Acromioclavicular joint separation, type 2     Globus sensation, possibly secondary to GERD     Sciatica of left side vs knee pain     External hemorrhoid, bleeding     Abnormal EKG     Right leg pain     Skin lesion of back     Chronic bilateral low back pain without sciatica     Chronic right shoulder pain    Mental Status/Communication: ***  Learns Best: ***  Primary Language: ***; Requires Interpreter: ***     Objective:     ***  Physical Therapy Plan of Care  IH:KVQQVZD:Pieter Cohen, MD  Referring Provider: Greer Eeohen, Pieter, MD  Diagnosis: No diagnosis found.    Assessment/Objective Findings: Patient is a 57 year old male with complaints of pain in his {RIGHT-LEFT, NO CAPS:5604::"bilateral"} {[place] amb pt parts of the body:13826}.     Pt reports onset of pain ***  due to ***. Pt currently works ***, with baseline physical activities including ***. Pt expresses long term goal of ***, is motivated to work towards this with his physical therapist.    Clinical presentation today is consistent with ***. Pt will benefit from physical therapist management, with focus on *** to address the following problems and impairments noted upon evaluation: {PT PROBLEM LIST:12807}.     These problems limit the patient with the following  functional activities: ***. The prescribed treatment plan of care is medically necessary.    Co-morbidities of *** were identified and taken into considerations of plan of care.     Short Term Goals (2-4 weeks):  ***  Long Term Goals (6-8 weeks):  ***    Treatment Plan: {OT/PT TREATMENT PLAN:12724}    NPV: ***    Recommend appointments {NUMBERS 1-6:6} times per week for {NUMBERS 1-12:10} weeks, after which a reassessment for continued need for physical therapist management will occur.  The rehabilitation potential for this patient is {EXCELLENT/GOOD/FAIR/POOR:12734}.    Patient Meryle Ready/Family is aware of attendance policy: {Yes/No/NA:10764::"Yes"}  Plan of care discussed with Patient/Family: {Yes/No/NA:10764::"Yes"}  Patient goals reviewed and incorporated in plan of care: {Yes/No/NA:10764::"Yes"}  Patient/Family agrees with plan of care: {Yes/No/NA:10764::"Yes"}  Patient/Family education: {Yes/No/NA:10764::"Yes"}  Does patient feel safe at home: {yes DG:387564}no:314532}      Gabriela EvesKatherine Geanine Vandekamp, PT, DPT      Gabriela EvesKatherine Lorice Lafave, PT, Lic # [id number]

## 2018-06-13 ENCOUNTER — Ambulatory Visit (HOSPITAL_BASED_OUTPATIENT_CLINIC_OR_DEPARTMENT_OTHER): Payer: HMO | Admitting: Rehabilitative and Restorative Service Providers"

## 2018-06-13 ENCOUNTER — Encounter (HOSPITAL_BASED_OUTPATIENT_CLINIC_OR_DEPARTMENT_OTHER): Payer: Self-pay | Admitting: Rehabilitative and Restorative Service Providers"

## 2018-06-13 DIAGNOSIS — M545 Low back pain, unspecified: Secondary | ICD-10-CM

## 2018-06-13 DIAGNOSIS — G8929 Other chronic pain: Secondary | ICD-10-CM

## 2018-06-13 NOTE — Progress Notes (Signed)
OUTPATIENT EVALUATION    Referring Provider: Greer EePieter Cohen, MD  [address]  [facility]  [place], [place] [id number]    Precautions: None at this point.    SUBJECTIVE  Hx of Present Illness: Pt is a 57 year old male who presents to physical therapy with a physician diagnosis of Chronic bilateral low back pain without sciatica [M54.5, G89.29].  Pt reports pain after driving for 30 minutes, bending forward, going from sitting to standing.  Pt reports pain for 2-3 weeks.    Pt arrives 12 minutes late for eval today.    Onset Date: 05/2018  Onset Code:  ICD-10 Code: Chronic bilateral low back pain without sciatica [M54.5, G89.29]      Imaging: Exam: Lumbar Spine, 3 views    Indication: lower back pain    Comparison: 09/21/2013    Findings:     Vertebrae: Frontal standing view demonstrates a scoliosis convex to the patient's right with the apex at L2-3. Pedicles and sacroiliac joints are intact. Vertebral bodies maintain normal height.  Disk spaces: There is degenerative disc space narrowing and facet hypertrophy at all lumbar levels.  Alignment: There is straightening of the normal lumbar lordosis.  Soft tissues: Unremarkable.     Impression: Scoliosis with multilevel degenerative disc space narrowing and facet hypertrophy resulting in straightening of the normal lumbar lordosis.        Reviewed and Electronically Signed by: Lennie HummerJane Auger   Signed Date/Time: 05-30-2018 16:15:12         SUBJECTIVE    Prior Level of Function: I    Pain Level: 4/10, 6/10 at worst    Dominant Extremity:     IADL'S/Work: IMPAIRED: Pt unable to drive/sit, bend forward, go from sitting to standing without low back pain.    Dressing/Grooming: WNL    Driving/sitting tolerance: IMPAIRED: as detailed above.    Sleeping: WNL    Aggravating Factors: Sitting, transfers, bending forward, driving.    Alleviating Factors: Stretching, pills.    Past Medical History: See Medical Record    Medications: (Rx Comments, concerns): For a list of  current medications review the Medication activity.   Mental Status/Communication: WNL    Learns Best: Practice, demonstration.      Objective:     06/13/18 1500   Language Information   Language of Care English   Evaluation Type   Evaluation Type Initial Evaluation   Rehab Discipline   Rehab Discipline PT   Visit   Visit number 1   POC Due date 07/13/2018   Pain   Pain Score 4    Precautions   Precautions No   Patient Stated Goals   Patient stated goals to be in less low back pain   Strengths   Strengths Ability to acquire knowledge;Attitude of self;Previous rehab experience;Premorbid level of function   Posture   Posterior Pelvic Tilt  Minimal   Posture assessment X   Spine Assessment   Spine Assessment Lumbar   Lumbar Assessment   Flexion 100% and ok   Extension 100% and minor pain   L Rotation 100% and ok   R Rotation 100% and ok   L Side Bend 100% and ok   R Side Bend 100% and minor pain   LLE Assessment   LLE Assessment X   PROM LLE (degrees)   L Hip Internal Rotation 0-35 20 Degrees   L Hip External Rotation 0-45 35 Degrees   Strength LLE   L Hip Flexion 4+/5  L Hip Extension 4-/5   L Hip ABduction 4+/5   L Knee Flexion 4+/5   L Knee Extension 4+/5   L Ankle Dorsiflexion 4+/5   LLE Muscle Length Assessment   Muscle length (comment) HS 80 degrees   RLE Assessment   RLE Assessment X   PROM RLE (degrees)   R Hip Internal Rotation 0-35 20 Degrees   R Hip External Rotation 0-45 35 Degrees   Strength RLE   R Hip Flexion 4+/5   R Hip Extension 4-/5   R Hip ABduction 4+/5   R Knee Flexion 4+/5   R Knee Extension 4+/5   R Ankle Dorsiflexion 4+/5   RLE Muscle Length Assessment   Muscle length (comment) HS 80 degrees   Clinical Special Tests   Special Tests Yes   Lumbar/Sacroiliac results   L Slump Positive   R Slump Negative   L SLR  Negative   R SLR  Negative   L Faber Negative   R Faber Negative   L Thigh Thrust Negative   R Thigh Thrust Negative   Sensation   Light Touch No apparent deficits   Coordination   Gross  Motor Performance Observation Stiff   Functional Mobility   Gait symmetrical   Palpation   Tenderness to Palpation PA mobs L1-5   Increased Tissue Density none noted   Spine Joint Mobility (0-6 scale)   Spine Mobility Assessment  Yes   Lumbar   Passive Accessory Intervertebral Movement (PAIVM) 3/6   Patient Education   What was taught? role of PT, plan of care, HEP (Bridging, TA Braced Marching, Dead Bugs, S/L Hip Abduction)   Method Verbal;Practice   Patient comprehension Yes     Myer Haff, PT, Lic # [id number]    FUNCTIONAL DEFICITS: Pt unable to drive/sit, bend forward, go from sitting to standing without low back pain.  FUNCTIONAL OUTCOME MEASURES:       Physical Therapy Plan of Care    UE:AVWUJW Cohen, MD  Referring Provider: Greer Ee, MD  [address]  [facility]  , [place] [address]  Diagnosis: Chronic bilateral low back pain without sciatica [M54.5, G89.29]    Assessment/Objective Findings: Patient is a 57 year old male with complaints of pain in his lower back.  The following PT problems were noted upon evaluation: decreased strength of B/L hip extension, TTP over entire lumbar spine to PA mobs, Positive L Slump test, decreased IR and ER of B/L hips, pain with lumbar extension. These problems limit the patient with the following functional activities: Pt unable to drive/sit, bend forward, go from sitting to standing without low back pain. This patient will benefit from skilled PT plan of care. The prescribed treatment plan of care is medically necessary secondary to Chronic bilateral low back pain without sciatica [M54.5, G89.29].  Co-morbidities of Patient Active Problem List:     Mallet finger     Family history of diabetes mellitus     Systolic murmur     Vitiligo     Acromioclavicular joint separation, type 2     Globus sensation, possibly secondary to GERD     Sciatica of left side vs knee pain     External hemorrhoid, bleeding     Abnormal EKG     Right leg pain     Skin lesion  of back     Chronic bilateral low back pain without sciatica     Chronic right shoulder pain   were  identified and taken into considerations of plan of care.  In my professional opinion this patient requires skilled physical therapy to address the concerns of the functional limitations outlined above.    Short Term Functional Goals: 4 weeks.   Pt will demonstrate independence and compliance with HEP in 2 weeks  Pt will demonstrate improved postural awareness by sitting with biomechanically correct posture > 30 minutes to improve overall postural function in 4 weeks    Pt will demonstrate improved strength of B/L hip extension by 1 MMT Grade in 4 weeks   Pt will demonstrate improved tolerance of driving by 2/132/10 pain in 4 weeks    Pt will demonstrate improved tolerance of bending foward by 2/10 pain in 4 weeks      Long Term Goal: 8 weeks.   Pt will demonstrate ability to drive without low back pain in 8 weeks.  Pt will demonstrate ability to bend forward without low back pain in 8 weeks.    Treatment Plan: ** PT Eval - Moderate Complexity (CPT 97162)  ** PT Re-Eval (CPT 97164)  ** Stretching/ROM/Therapeutic Exercise [phone](97110)  ** Home Exercise Program/ Patient Education (CPT [phone])  ** Neuromuscular Re-education (CPT [phone])  ** Manual Therapy / Joint / Soft tissue Mobilization (CPT 97140)  ** Electrical Stimulation/TENS (CPT 97014)  ** Hot/Cold Rx (CPT 97010)  ** Mechanical Traction (CPT [id number])  ** Gait Training (CPT [id number])  ** Functional Activities (CPT 97530)    Recommend Physical Therapy be continued 2 times per week for 4 weeks.  The rehabilitation potential for this patient is good  Based on comprehensive examination and evaluation, this patient meets criteria for MODERATE complexity due to combined factor(s) of stable presentation of 2-3 MSK segments.    Patient Meryle Ready/Family is aware of attendance policy: Yes  Plan of care discussed with Patient/Family: Yes  Patient goals reviewed and incorporated in plan of care:  Yes  Patient/Family agrees with plan of care: Yes  Patient/Family education: Yes  Does patient feel safe at home:  Yes      Myer Haffichard K Kloey Cazarez, PT, DPT, HFS #[id number]            Myer Haffichard K Hiilani Jetter, PT, Lic # [id number]

## 2018-06-14 NOTE — Progress Notes (Signed)
I certify that the documented Treatment Plan is reasonable and necessary.    06/14/2018  Greer EePieter Kendrick Remigio, MD

## 2018-06-22 ENCOUNTER — Ambulatory Visit (HOSPITAL_BASED_OUTPATIENT_CLINIC_OR_DEPARTMENT_OTHER): Payer: HMO | Admitting: Rehabilitative and Restorative Service Providers"

## 2018-06-22 DIAGNOSIS — M545 Low back pain, unspecified: Secondary | ICD-10-CM

## 2018-06-22 DIAGNOSIS — G8929 Other chronic pain: Secondary | ICD-10-CM

## 2018-06-22 NOTE — Progress Notes (Signed)
S: Pt reports 5/10 back pain, did the HEP.  O: Refer to Rehabilitation Treatment Flowsheet     06/22/18 1600   Language Information   Language of Care English   Precautions   Precautions No   Rehab Discipline   Rehab Discipline PT   Visit   Visit number 2   POC Due date 07/13/2018   Time Calculation   Start Time 1600   Stop Time 1630   Time Calculation (min) 30 min   Pain   Pain Score 5    Ther Exercise   Exercise B/L Piriformis Stretch   Holds 30 seconds each   Ther Exercise 2   Exercise LTR   Holds 2 30   Ther Exercise 3   Exercise 3 TA Braced March   Holds 3 40   Ther Exercise 4   Exercise 4 Bridge   Holds 4 30   Ther Exercise 5   Exercise 5 S/L Hip Abduction   Holds 5 30 each   Ther Exercise 6   Exercise 6 Sit-Stand   Holds 6 20#s x 20   Ther Exercise 7   Exercise 7 Plan: Bird Dog   Holds 7 40   Ther Exercise 8   Exercise 8 Plan: Pallof Press   Holds 8 9#s x 20   Ther Exercise 9   Exercise 9 Bike 5 Min   Patient Education   What was taught? continue HEP   Method Verbal;Practice   Patient comprehension Yes     Myer Haffichard K Keavon Sensing, PT, Lic # [id number]  A: Pt tolerated therex well, small VCs needed to ensure proper execution.  Pt displays proper hip hinging during sit-stand/squat, vertical tibia and torso with abnormal bracing.  Pt is in need of continued skilled PT.  P: progress core strengthening.    Myer Haffichard K Alize Borrayo, PT, Lic # [id number]

## 2018-06-26 ENCOUNTER — Ambulatory Visit (HOSPITAL_BASED_OUTPATIENT_CLINIC_OR_DEPARTMENT_OTHER): Payer: HMO | Admitting: Rehabilitative and Restorative Service Providers"

## 2018-06-26 DIAGNOSIS — M545 Low back pain, unspecified: Secondary | ICD-10-CM

## 2018-06-26 DIAGNOSIS — G8929 Other chronic pain: Secondary | ICD-10-CM | POA: Diagnosis not present

## 2018-06-26 NOTE — Progress Notes (Signed)
S: Pt reports pain to be 4/10 before and post rx at back. Pain still increases with lifting.  Reports compliance with HEP. No adverse effect from last rx.   O: Refer to Rehabilitation Treatment Flowsheet below.   A: Pt reports poor body mechanics during work - education on proper lifting. Reported mild left side back discomfort at end of deadbug exercise but resolved after stretches.  Demo good understanding of exercises with moderate verbal and visual cueing. Tolerated increased weights and repetitions during therex.    P:Continue working on core + BLEs strengthening and proper lifting technique. Trial ?deadlift next. Provided HEP of today's exercises if pt tol well. Review lifting technique.    Jeanie CooksJuliane D Ciji Lookout, PT, Lic # [id number]      60/45/4008/26/19 1500   Language Information   Language of Care English   Precautions   Precautions No   Rehab Discipline   Rehab Discipline PT   Visit   Visit number 3   POC Due date 07/13/2018   Time Calculation   Start Time 1600   Pain   Pain Score 4    Ther Exercise 2   Exercise LTR with yellow ball   Holds 2 1x30   Ther Exercise 3   Exercise 3 Deadbug    Holds 3 3x10   Ther Exercise 4   Exercise 4 Bridge with GTB   Holds 4 3x10   Ther Exercise 5   Exercise 5 Lateral walk GTB   Holds 5 10steps x 3    Ther Exercise 6   Exercise 6 Sit<>stand from low table   Holds 6 GTB+ 20# KB 3x10    Ther Exercise 8   Exercise 8 Pallof press    Holds 8 9.5# 2 x 15   Ther Exercise 10   Exercise 10 Stretches: DKTC, childs pose 3-ways, QL, piriformis   Holds 10 2x30sec each    Patient Education   What was taught? COnt HEP   Method Verbal;Practice   Patient comprehension Yes

## 2018-06-28 ENCOUNTER — Ambulatory Visit: Payer: HMO | Attending: Internal Medicine | Admitting: Rehabilitative and Restorative Service Providers"

## 2018-06-28 DIAGNOSIS — M545 Low back pain, unspecified: Secondary | ICD-10-CM

## 2018-06-28 DIAGNOSIS — G8929 Other chronic pain: Secondary | ICD-10-CM

## 2018-06-28 NOTE — Progress Notes (Signed)
S:  Patient reports 0/10 LS pain today; pt has been utilizing proper lifting mechanics taught at last PT session.      O:  Refer to rehab tx flow sheet    A:  Mild L knee valgus with hip hinge along with slight increased LS pain. Excellent control with slight over correction of hip rotation with bird dog activity.        P:  Follow POC; continue to strengthen gluteal to tolerate hip hinge; if improved trial deadlift.        06/28/18 1500   Language Information   Language of Care English   Rehab Discipline   Rehab Discipline PT   Visit   Visit number 4   POC Due date 07/13/2018   Time Calculation   Start Time 1600   Stop Time 1630   Time Calculation (min) 30 min   Pain   Pain Score 0    Manual Therapy   Manual Therapy No   Ther Exercise   Therapeutic Exercise? Yes   Ther Exercise 4   Exercise 4 Bridge with GTB   Reps 4 12   Sets 4 3   Ther Exercise 7   Holds 7 5 each   Exercise 7 brid dog    Reps 7 10   Sets 7 1   Ther Exercise 8   Exercise 8 Pallof press a/ lateral GTB steps   Reps 8 8   Sets 8 1   Ther Exercise 9   Exercise 9 global/r bike    Holds 9 5 mins   Ther Exercise 10   Holds 10 3x30"    Exercise 10 Stretches: SKTC/piriformis   Ther Exercise 11   Exercise 11 hip hinge w/ dowel   Reps 11 10   Sets 11 1   Patient Education   What was taught? HEP cont bridge; add bird dog   Method Demo;Verbal;Practice   Patient comprehension Yes       Sarita Haverhristine Maxwel Meadowcroft, PTA, Lic # [id number]

## 2018-07-04 ENCOUNTER — Ambulatory Visit (HOSPITAL_BASED_OUTPATIENT_CLINIC_OR_DEPARTMENT_OTHER): Payer: HMO | Admitting: Rehabilitative and Restorative Service Providers"

## 2018-07-04 DIAGNOSIS — G8929 Other chronic pain: Secondary | ICD-10-CM | POA: Diagnosis present

## 2018-07-04 DIAGNOSIS — M545 Low back pain, unspecified: Secondary | ICD-10-CM

## 2018-07-04 NOTE — Progress Notes (Signed)
S:  Patient reports 0/10 LS pain today; pain continues with work of lifting and shoveling.  Pt arrives 15 minutes late for scheduled appointment.      O:  Refer to rehab tx flow sheet    A:  Patient responded well to tx; noted R lateral weight shift with hip hinge activity; utilized cook band for correction; good demonstration and pacing of progressed bridge.  No LS pain or discomfort w/ tx; session was modified due to pt arriving late.    P:  Follow POC, trial dead lift NV.       2018-07-14 1400   Language Information   Language of Care English   Rehab Discipline   Rehab Discipline PT   Visit   Visit number 5   POC Due date 07/13/2018   Time Calculation   Start Time 1445   Stop Time 1500   Time Calculation (min) 15 min   Pain   Pain Score 0    Manual Therapy   Manual Therapy No   Ther Exercise   Therapeutic Exercise? Yes   Ther Exercise 4   Exercise 4 Bridge with marches BTB   Reps 4 12   Sets 4 2   Ther Exercise 5   Exercise 5 Lateral walk BTB   Holds 5 30'x4 laps   Ther Exercise 11   Reps 11 12   Sets 11 2   Exercise 11 hip hinge w/ dowel   Holds 11 cookband to correct R lateral weight shift   Patient Education   What was taught? HEP progress BTB above   Method Verbal;Demo;Practice   Patient comprehension Yes       Sarita Haver, PTA, Lic # [id number]

## 2018-07-10 ENCOUNTER — Ambulatory Visit (HOSPITAL_BASED_OUTPATIENT_CLINIC_OR_DEPARTMENT_OTHER): Payer: HMO | Admitting: Rehabilitative and Restorative Service Providers"

## 2018-07-10 DIAGNOSIS — M545 Low back pain: Secondary | ICD-10-CM | POA: Diagnosis not present

## 2018-07-10 DIAGNOSIS — G8929 Other chronic pain: Secondary | ICD-10-CM

## 2018-07-10 NOTE — Progress Notes (Signed)
Craig Peterson, PT, Lic # [id number]  S: Pt reports today he has " a little pain when I bend forward", 4/10.  "I think next visit is my last".  O: Refer to Rehabilitation Treatment Flowsheet     07/10/18 1500   Language Information   Language of Care English   Rehab Discipline   Rehab Discipline PT   Visit   Visit number 6   POC Due date d/c on 07/12/18   Time Calculation   Start Time 1600   Stop Time 1630   Time Calculation (min) 30 min   Pain   Pain Score 4    Ther Exercise   Exercise B/L Piriformis Stretch   Holds 30 seconds each   Ther Exercise 2   Exercise LTR   Holds 2 30   Ther Exercise 3   Exercise 3 Deadbug   Holds 3 3x10   Ther Exercise 4   Exercise 4 Bridge   Holds 4 3x10   Ther Exercise 5   Exercise 5 Lateral walk BTB   Holds 5 30'x4 laps   Ther Exercise 6   Exercise 6 Sit-Stand   Holds 6 Black TB x20+20# KB    Ther Exercise 7   Holds 7 30   Exercise 7 Bird Dog   Ther Exercise 8   Exercise 8 Pallof Press   Holds 8 9#s x 20 each side   Ther Exercise 9   Exercise 9 Bike 5 min   Patient Education   What was taught? HEP progress BTB above   Method Verbal;Demo;Practice   Patient comprehension Yes     Craig Peterson, PT, Lic # [id number]  A: The nature of this pt's pain is chronic and fluctuating in nature.  Pt tolerated therex well today, no pain noted.  Pt has made intermittent progress with pain scores and is able to manage his pain at home.  Pt will be discharged at next visit to continue HEP at home.  P: discharge at next visit.

## 2018-07-12 ENCOUNTER — Encounter (HOSPITAL_BASED_OUTPATIENT_CLINIC_OR_DEPARTMENT_OTHER): Payer: Self-pay | Admitting: Rehabilitative and Restorative Service Providers"

## 2018-07-12 ENCOUNTER — Ambulatory Visit: Payer: HMO | Attending: Internal Medicine | Admitting: Rehabilitative and Restorative Service Providers"

## 2018-07-12 DIAGNOSIS — G8929 Other chronic pain: Secondary | ICD-10-CM

## 2018-07-12 DIAGNOSIS — M545 Low back pain, unspecified: Secondary | ICD-10-CM

## 2018-07-12 NOTE — Progress Notes (Signed)
S: Pt reports his shoulder hurts him when he uses a shovel.  No back pain today, 0/10.  O: Refer to Rehabilitation Treatment Flowsheet     07/12/18 1600   Language Information   Language of Care English   Rehab Discipline   Rehab Discipline PT   Visit   Visit number 7   POC Due date d/c   Time Calculation   Start Time 1630   Stop Time 1700   Time Calculation (min) 30 min   Pain   Pain Score 0    Ther Exercise   Exercise B/L Piriformis Stretch   Holds 30 seconds each   Ther Exercise 2   Exercise LTR   Holds 2 30   Ther Exercise 3   Exercise 3 Deadbug   Holds 3 3x10   Ther Exercise 4   Exercise 4 Bridge   Holds 4 3x10   Ther Exercise 5   Exercise 5 Lateral walk BTB   Holds 5 30'x4 laps   Ther Exercise 6   Exercise 6 Sit-Stand   Holds 6 Black TB x30+20# KB    Ther Exercise 7   Holds 7 30   Exercise 7 Bird Dog   Ther Exercise 8   Exercise 8 Pallof Press   Holds 8 9#s x 20 each side   Ther Exercise 9   Exercise 9 Bike 5 min   Patient Education   What was taught? d/c   Method Verbal   Patient comprehension Yes     Myer Haff, PT, Lic # [id number]  A: Pt tolerated therex well today, no pain noted.  Pt displays good lumbopelvic control during all therex especially sit-stands and lat band walks.  Pt has made substantial progress in PT, is pain-free, independent in HEP, and will be discharged today.  P: discharge patient.    Myer Haff, PT, Lic # [id number]

## 2018-07-12 NOTE — Progress Notes (Signed)
OUTPATIENT RE-CERTIFICATION    Referring Provider:   Greer Ee, MD     [address]  [facility]  [place], [place] [address]       Precautions: None at this point.    SUBJECTIVE  Hx of Present Illness: Pt is a 57 year old male who presents to physical therapy with a physician diagnosis of Chronic bilateral low back pain without sciatica [M54.5, G89.29].  Pt reports pain after driving for 30 minutes, bending forward, going from sitting to standing.  Pt reports pain for 2-3 weeks.    Pt arrives 12 minutes late for eval today.    07/12/2018: Pt is a 57 y/o M who has been regularly presenting to PT for treatment of chronic B/L low back pain without sciatica.  Pt reports he's doing well and has no pain, wants to finish PT today for his back.

## 2018-07-12 NOTE — Progress Notes (Signed)
Myer Haff, PT, Lic # [id number]         Physical Therapy Discharge Note    DIAGNOSIS:   Chronic B/L LBP without sciatica    MD:   Greer Ee, MD     Your patient, Craig Peterson, has been discharged from Physical Therapy after a total of 7 visits.      REASONS FOR DISCHARGE:  This patient has made substantial progress, is independent in HEP and is pain-free.      RECOMMENDATIONS:  This patient should continue HEP at home and contact PCP with further pain.      If you have any questions please feel free to contact me by email or phone.      Thank you,

## 2019-03-13 LAB — COVID-19 CARE EVERYWHERE
COVID-19 CARE EVERYWHERE: NEGATIVE — NL
EXPIRATION DATE CARE EVERYWHERE: 10062020 — NL

## 2019-06-18 LAB — ABO/RH CARE EVERYWHERE: ABO/RH CARE EVERYWHERE: O POS — NL

## 2019-06-18 LAB — PROTHROMBIN TIME CARE EVERYWHERE
INR CARE EVERYWHERE: 1.02 — NL (ref 0.91–1.16)
PROTHROMBIN TIME CARE EVERYWHERE: 11.1 s — NL (ref 9.5–12.1)

## 2019-06-18 MED ORDER — HYDROMORPHONE HCL 1 MG/ML IJ SOLN
.50 mg | INTRAMUSCULAR | Status: DC
Start: ? — End: 2019-06-18

## 2019-06-18 MED ORDER — FENTANYL CITRATE (PF) 1000 MCG/20ML IJ SOLN
25.00 ug | INTRAMUSCULAR | Status: DC
Start: ? — End: 2019-06-18

## 2019-06-18 MED ORDER — BUPIVACAINE-EPINEPHRINE (PF) 0.25% -1:200000 IJ SOLN
INTRAMUSCULAR | Status: DC
Start: ? — End: 2019-06-18

## 2019-06-18 MED ORDER — GENERIC EXTERNAL MEDICATION
0.25 mg | Status: DC
Start: ? — End: 2019-06-18

## 2019-06-18 MED ORDER — EPHEDRINE SULFATE 50 MG/ML IJ SOLN
10.00 mg | INTRAMUSCULAR | Status: DC
Start: ? — End: 2019-06-18

## 2019-06-18 MED ORDER — ACETAMINOPHEN 325 MG PO TABS
650.00 mg | ORAL_TABLET | ORAL | Status: DC
Start: ? — End: 2019-06-18

## 2019-06-18 MED ORDER — METOCLOPRAMIDE HCL 5 MG/ML IJ SOLN
10.00 mg | INTRAMUSCULAR | Status: DC
Start: ? — End: 2019-06-18

## 2019-06-18 MED ORDER — ONDANSETRON HCL 4 MG/2ML IJ SOLN
4.00 mg | INTRAMUSCULAR | Status: DC
Start: ? — End: 2019-06-18

## 2019-06-18 MED ORDER — LORAZEPAM 2 MG/ML IJ SOLN
0.50 mg | INTRAMUSCULAR | Status: DC
Start: ? — End: 2019-06-18

## 2019-06-18 MED ORDER — IOPAMIDOL 76 % IV SOLN
100.00 mL | INTRAVENOUS | Status: DC
Start: ? — End: 2019-06-18

## 2019-06-18 MED ORDER — LACTATED RINGERS IV SOLN
INTRAVENOUS | Status: DC
Start: ? — End: 2019-06-18

## 2019-11-14 ENCOUNTER — Ambulatory Visit: Payer: HMO | Admitting: Occupational Medicine

## 2020-02-05 ENCOUNTER — Encounter (HOSPITAL_BASED_OUTPATIENT_CLINIC_OR_DEPARTMENT_OTHER): Payer: Self-pay

## 2020-03-13 ENCOUNTER — Ambulatory Visit (HOSPITAL_BASED_OUTPATIENT_CLINIC_OR_DEPARTMENT_OTHER): Payer: Self-pay

## 2020-03-13 NOTE — Telephone Encounter (Signed)
Left msg to call and speak with RN

## 2020-03-13 NOTE — Telephone Encounter (Signed)
Regarding: bladder pain?  ----- Message from Riccardo Dubin sent at 03/13/2020  4:49 PM EDT -----  Craig Peterson [phone], 59 year old, male    Calls today:  Sick    What are the symptoms ; Been feeling pain on his bladder ? wants to be seen  How long has patient been sick?   What has pt. tried at home   Person calling on behalf of patient: Patient (self)    CALL BACK NUMBER:     Cell phone:   Other phone:    Patient's language of care: English    Patient needs a [place] interpreter.    Patient's PCP: Greer Ee, MD

## 2020-03-14 ENCOUNTER — Emergency Department
Admission: AD | Admit: 2020-03-14 | Discharge: 2020-03-14 | Disposition: A | Discharge status: Home / Self Care | Payer: 59 | Source: Intra-hospital | Attending: Emergency Medicine | Admitting: Emergency Medicine

## 2020-03-14 DIAGNOSIS — R1030 Lower abdominal pain, unspecified: Secondary | ICD-10-CM | POA: Diagnosis not present

## 2020-03-14 DIAGNOSIS — R1024 Suprapubic pain: Secondary | ICD-10-CM

## 2020-03-14 DIAGNOSIS — R102 Pelvic and perineal pain: Secondary | ICD-10-CM

## 2020-03-14 DIAGNOSIS — R350 Frequency of micturition: Secondary | ICD-10-CM | POA: Diagnosis present

## 2020-03-14 DIAGNOSIS — R3915 Urgency of urination: Secondary | ICD-10-CM | POA: Diagnosis present

## 2020-03-14 DIAGNOSIS — R3129 Other microscopic hematuria: Secondary | ICD-10-CM | POA: Diagnosis not present

## 2020-03-14 LAB — POC URINALYSIS
BILIRUBIN, URINE: NEGATIVE
GLUCOSE,URINE: NEGATIVE
KETONE, URINE: NEGATIVE
LEUKOCYTE ESTERASE: NEGATIVE
NITRITE, URINE: NEGATIVE
PH URINE: 5.5 (ref 5.0–8.0)
PROTEIN, URINE: NEGATIVE
SPECIFIC GRAVITY, URINE: 1.01 (ref 1.003–1.030)
UROBILINOGEN URINE: 0.2 (ref 0.2–1.0)

## 2020-03-14 MED ORDER — SULFAMETHOXAZOLE-TRIMETHOPRIM 800-160 MG PO TABS
1.00 | ORAL_TABLET | Freq: Two times a day (BID) | ORAL | 0 refills | Status: AC
Start: 2020-03-14 — End: 2020-03-21
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
## 2020-03-14 NOTE — UC Triage Notes (Signed)
Patient reports he did urine test for work and there was blood in urine, sent here for f/u. Reports intermittent suprapubic pain and frequency x 3 weeks. Denies fevers chill,s NV. Well appearing.

## 2020-03-14 NOTE — Narrator Note (Signed)
Patient Disposition  Patient education for diagnosis, medications, activity, diet and follow-up.  Patient left UC 8:51 PM.  Patient rep received written instructions.    Interpreter to provide instructions: No    Patient belongings with patient: YES    Have all existing LDAs been addressed? N/A    Have all IV infusions been stopped? N/A    Destination: Home

## 2020-03-14 NOTE — Telephone Encounter (Signed)
Call to pt through interpretor. C/o lower abd pain for 3 weeks. Urinary frequency. Denies burning or blood. No fevers, nausea or vomiting. Pain is intermittent.     Advised UC for eval.

## 2020-03-19 ENCOUNTER — Encounter (HOSPITAL_BASED_OUTPATIENT_CLINIC_OR_DEPARTMENT_OTHER): Payer: Self-pay | Admitting: Internal Medicine

## 2020-03-19 DIAGNOSIS — N390 Urinary tract infection, site not specified: Secondary | ICD-10-CM | POA: Insufficient documentation

## 2020-03-19 DIAGNOSIS — R3129 Other microscopic hematuria: Secondary | ICD-10-CM | POA: Insufficient documentation

## 2020-05-07 ENCOUNTER — Ambulatory Visit: Payer: 59 | Attending: Urology | Admitting: Urology

## 2020-05-07 ENCOUNTER — Other Ambulatory Visit: Payer: Self-pay

## 2020-05-07 DIAGNOSIS — Z125 Encounter for screening for malignant neoplasm of prostate: Secondary | ICD-10-CM

## 2020-05-07 DIAGNOSIS — R3129 Other microscopic hematuria: Secondary | ICD-10-CM

## 2020-05-07 DIAGNOSIS — R102 Pelvic and perineal pain: Secondary | ICD-10-CM | POA: Insufficient documentation

## 2020-05-07 DIAGNOSIS — R1024 Suprapubic pain: Secondary | ICD-10-CM

## 2020-05-07 LAB — URINALYSIS
BILIRUBIN, URINE: NEGATIVE
CASTS: NONE SEEN PER LPF
CRYSTALS: NONE SEEN
GLUCOSE, URINE: NEGATIVE MG/DL
KETONE, URINE: NEGATIVE MG/DL
LEUKOCYTE ESTERASE: NEGATIVE
NITRITE, URINE: NEGATIVE
PH URINE: 6.5 (ref 5.0–8.0)
PROTEIN, URINE: NEGATIVE MG/DL
SPECIFIC GRAVITY URINE: 1.02 (ref 1.003–1.035)
WHITE BLOOD CELLS URINE: NONE SEEN PER HPF (ref 0–4)

## 2020-05-07 LAB — POC URINALYSIS
BILIRUBIN, URINE: NEGATIVE
GLUCOSE,URINE: NEGATIVE
KETONE, URINE: NEGATIVE
LEUKOCYTE ESTERASE: NEGATIVE
NITRITE, URINE: NEGATIVE
PH URINE: 6.5 (ref 5.0–8.0)
PROTEIN, URINE: NEGATIVE
SPECIFIC GRAVITY, URINE: 1.025 (ref 1.003–1.030)
UROBILINOGEN URINE: 0.2 (ref 0.2–1.0)
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
## 2020-05-08 ENCOUNTER — Other Ambulatory Visit (HOSPITAL_BASED_OUTPATIENT_CLINIC_OR_DEPARTMENT_OTHER): Payer: Self-pay | Admitting: Urology

## 2020-05-08 DIAGNOSIS — R3129 Other microscopic hematuria: Secondary | ICD-10-CM

## 2020-05-14 ENCOUNTER — Telehealth (HOSPITAL_BASED_OUTPATIENT_CLINIC_OR_DEPARTMENT_OTHER): Payer: Self-pay

## 2020-05-14 NOTE — Telephone Encounter (Signed)
I called the patient and Left a voicemail to return call    If patient calls back, please: inform [facility] a urology f/u appt has been scheduled for 08/11 at 11am with Dr. Guadlupe Spanish.

## 2020-06-09 ENCOUNTER — Other Ambulatory Visit (HOSPITAL_BASED_OUTPATIENT_CLINIC_OR_DEPARTMENT_OTHER): Payer: Self-pay

## 2020-06-11 ENCOUNTER — Other Ambulatory Visit: Payer: Self-pay

## 2020-06-11 ENCOUNTER — Ambulatory Visit: Payer: 59 | Attending: Urology | Admitting: Urology

## 2020-06-11 VITALS — BP 144/90 | HR 46

## 2020-06-11 DIAGNOSIS — R3129 Other microscopic hematuria: Secondary | ICD-10-CM

## 2020-06-11 LAB — POC URINALYSIS
BILIRUBIN, URINE: NEGATIVE
GLUCOSE,URINE: NEGATIVE
KETONE, URINE: NEGATIVE
LEUKOCYTE ESTERASE: NEGATIVE
NITRITE, URINE: NEGATIVE
PH URINE: 5 (ref 5.0–8.0)
PROTEIN, URINE: NEGATIVE
SPECIFIC GRAVITY, URINE: 1.02 (ref 1.003–1.030)
UROBILINOGEN URINE: 0.2 (ref 0.2–1.0)

## 2020-06-11 NOTE — Patient Instructions (Signed)
Patient Education     Cistoscopia  Cystoscopy  A cistoscopia é um procedimento usado para diagnosticar e, às vezes, tratar quadros clínicos que afetam o trato urinário inferior. O trato urinário inferior inclui a bexiga e a uretra. A uretra é o tubo que drena a urina para fora da bexiga. A cistoscopia é feita usando um instrumento fino em forma de tubo com uma luz e uma câmera na ponta (cistoscópio). O cistoscópio pode ser duro ou flexível, dependendo do objetivo do procedimento. O cistoscópio é inserido na bexiga através da uretra.  Uma cistoscopia pode ser recomendada se você apresentar:  · Infecções do trato urinário que sempre se repetem.  · Sangue na urina (hematúria).  · Incapacidade de segurar a urina (incontinência urinária) ou uma bexiga hiperativa.  · Células incomuns encontradas em uma amostra de urina.  · Um bloqueio da uretra, como um cálculo urinário.  · Dor ao urinar.  · Uma anormalidade da bexiga encontrada durante um pielograma intravenoso (PIV) ou um exame de TC (tomografia computadorizada).  Uma cistoscopia também pode ser realizada para remover uma amostra de tecido para exame ao microscópio (biópsia).  Informe o médico sobre:  · Quaisquer alergias.  · Todos os medicamentos que estiver usando, incluindo vitaminas, fitoterápicos, colírios, cremes e medicamentos de venda sem receita médica.  · Quaisquer problemas que você ou parentes seus tiverem experimentado com anestésicos.  · Quaisquer doenças sanguíneas.  · Cirurgias anteriores.  · Quaisquer quadros clínicos.  · Gravidez ou possibilidade de gravidez.  Quais são os riscos?  Esse procedimento geralmente é seguro. Mas problemas podem ocorrer, incluindo:  · Infecção.  · Hemorragia.  · Reações alérgicas a medicamentos.  · Danos a outras estruturas ou órgãos.  O que acontece antes do procedimento?  · Pergunte ao seu médico sobre:  ? Alteração ou interrupção dos seus medicamentos regulares. Isso será especialmente importante se você estiver tomando  medicamentos para diabetes ou anticoagulantes.  ? Uso de medicamentos como aspirina e ibuprofeno. Esses medicamentos podem afinar seu sangue. Não tome esses medicamentos a menos que seu médico permita.  ? O uso de medicamentos, vitaminas, fitoterápicos e suplementos vendidos sem receita médica.  · Siga as instruções do seu médico no que se refere a restrições de alimentos ou bebidas.  · Pergunte ao seu médico quais medidas serão tomadas para prevenir infecções. Isso pode incluir:  ? Lavar a pele com um sabão que mata os germes.  ? Tomar antibiótico.  · Você pode fazer um exame ou teste como:  ? Radiografias da bexiga, uretra ou rins.  ? Exames de urina para verificar se há sinais de infecção.  · Planeje para que alguém leve você para casa do hospital ou clínica.  O que acontece durante o procedimento?    · Você receberá um ou mais dos seguintes itens:  ? Um medicamento para ajudar você a relaxar (sedativo).  ? Um medicamento para entorpecer a área (anestesia local).  · Será feita limpeza da área ao redor da abertura da sua uretra.  · O cistoscópio será inserido na sua bexiga através da uretra.  · Um líquido sem germes (estéril) fluirá através do cistoscópio para encher sua bexiga. O líquido vai inflar sua bexiga para que o médico consiga examinar as paredes dela com clareza.  · Seu médico examinará a uretra e a bexiga. Seu médico pode fazer uma biópsia ou remover cálculos.  · O cistoscópio será removido, e sua bexiga será esvaziada.  O procedimento pode variar entre médicos e hospitais.    O que posso esperar após o procedimento?  Após o procedimento, é comum que ocorra:  · Algum desconforto ou dor no abdome e na uretra.  · Sintomas urinários. Isso inclui:  ? Dor leve ou sensação de queimação ao urinar. A dor deve parar em alguns minutos após urinar. Isso pode durar até 1 semana.  ? Uma pequena quantidade de sangue em sua urina por alguns dias.  ? Sensação de querer urinar, mas produzir somente uma pequena quantidade  de urina.  Siga essas instruções em casa:  Medicamentos  · Tome medicamentos vendidos com ou sem receita médica somente de acordo com as indicações do seu médico.  · Caso tenha recebido uma prescrição de antibiótico, tome-o somente conforme a orientação do seu médico. Não pare de tomar o antibiótico mesmo se começar a se sentir melhor.  Instruções gerais  · Retorne a suas atividades normais de acordo com as orientações do seu médico. Pergunte ao seu médico quais atividades são seguras para você.  · Não dirija por 24 horas caso tenha recebido um sedativo durante o procedimento.  · Fique atento para qualquer traço de sangue em sua urina. Se a quantidade de sangue em sua urina aumentar, ligue para seu médico.  · Siga as instruções do seu médico no que se refere a restrições de alimentos ou bebidas.  · Se uma amostra de tecido foi coletada para exame (biópsia) durante o procedimento, você é quem deve retirar os resultados. Pergunte ao médico ou ao departamento que realizou o exame quando os resultados estarão prontos.  · Beba líquidos em quantidade suficiente para manter a urina na cor amarelo-pálida.  · Compareça a todas as consultas de acompanhamento de acordo com as orientações do seu médico. Isso é importante.  Entre em contato com um médico se você:  · Sentir dor que piora ou não melhora com medicamentos, especialmente dor ao urinar.  · Tiver dificuldade para urinar.  · Surgir mais sangue na sua urina.  Busque ajuda imediatamente se você:  · Notar coágulos de sangue em sua urina.  · Tiver dor na barriga.  · Tiver febre ou calafrios.  · Não conseguir urinar.  Resumo  · A cistoscopia é um procedimento usado para diagnosticar e, às vezes, tratar quadros clínicos que afetam o trato urinário inferior.  · A cistoscopia é feita usando um instrumento fino em forma de tubo com uma luz e uma câmera na ponta.  · Após o procedimento, é comum sentir certa dor ou incômodo no abdome e na uretra.  · Fique atento para qualquer  traço de sangue em sua urina. Se a quantidade de sangue em sua urina aumentar, ligue para seu médico.  · Caso tenha recebido uma prescrição de antibiótico, tome-o somente conforme a orientação do seu médico. Não pare de tomar o antibiótico mesmo se começar a se sentir melhor.  Estas informações não se destinam a substituir as recomendações de seu médico. Não deixe de discutir quaisquer dúvidas com seu médico.  Document Revised: 11/03/2018 Document Reviewed: 11/03/2018  Elsevier Patient Education © 2021 [employer]

## 2020-06-11 NOTE — Progress Notes (Signed)
Clinic Procedure Note    The patient is [place] speaking, and encounter performed with phone interpreter.      Name: Craig Peterson    MRN: [id number]    DOB: 1961/07/27     Date: 06/11/2020     Pre-operative Diagnosis:  Microscopic Hematuria     Post-operative Diagnosis:  Same     OPERATION:  1. Cystoscopy (CPT 52000)     Surgeon: Spero Curb MD        Anesthesia: 2% Lidocaine jelly per urethra.     Estimated Blood Loss: None.     Fluids in: None.      Specimen:  None     Procedure Findings: The anterior urethra was without stricture.  The prostate was unremarkable.  The ureteral orifices were seen in their expected locations and there was evidence of clear efflux.  The bladder lacked trabeculations.  There were no bladder or urethral mucosal lesions concerning for disease .     Indications for Prodecure: The patient is a 59 year old male who presents today for cystoscopic evaluation.  The risks, benefits and alternatives to procedure explained to the patient in detail and the patient agreed to proceed. Consent was obtained.      Description of Procedure: The patient was taken to the office procedure room after a standard antibiotic preparation.  The patient was placed in a supine position on the procedure table.  The external genitalia was prepped and draped in the usual fashion for cytoscopy.  Local anesthesia with 2% Lidocaine jelly per urethra was administrated.  After a surgical timeout, a flexible cystoscope was atraumatically passed into the bladder.  Cystopanendoscopy was performed.  The findings are as noted above.  The cystoscope was then removed without difficulty.  The patient tolerated the procedure without difficulty.      ATTESTATION: I was the attending for this case and present for all aspects of the procedure including insertion and removal of all endoscopic equipment.       RTC 4 weeks for RUS review

## 2020-06-11 NOTE — Nursing Note (Signed)
Pt. pulse was 44 pt. Denies headache, chest pain, N/V , dizziness or any other symptoms  Pt. States that this is normal for him and the pulse always runs this low.  Pt. Felt stable to leave clinic.

## 2020-06-11 NOTE — Nursing Note (Signed)
[employer]. interpreter  Patient underwent cysto with Dr. Guadlupe Spanish. Tolerated procedure well, with VS returning to baseline.  No complaints of pain or discomfort. Teaching was done regarding importance of increased fluid intake, the possibility of burning on urination or blood in urine for the next couple of days, as well as when to call for questions or concerns.

## 2020-07-08 ENCOUNTER — Ambulatory Visit
Admission: RE | Admit: 2020-07-08 | Discharge: 2020-07-08 | Disposition: A | Discharge status: Home / Self Care | Payer: 59 | Attending: Urology | Admitting: Urology

## 2020-07-08 ENCOUNTER — Other Ambulatory Visit: Payer: Self-pay

## 2020-07-08 DIAGNOSIS — R319 Hematuria, unspecified: Secondary | ICD-10-CM | POA: Insufficient documentation

## 2020-07-08 DIAGNOSIS — R3129 Other microscopic hematuria: Secondary | ICD-10-CM

## 2020-07-22 ENCOUNTER — Ambulatory Visit
Admission: RE | Admit: 2020-07-22 | Discharge: 2020-07-22 | Disposition: A | Discharge status: Home / Self Care | Payer: 59 | Attending: Specialist | Admitting: Specialist

## 2020-07-22 ENCOUNTER — Ambulatory Visit (HOSPITAL_BASED_OUTPATIENT_CLINIC_OR_DEPARTMENT_OTHER): Payer: 59 | Admitting: Specialist

## 2020-07-22 ENCOUNTER — Other Ambulatory Visit: Payer: Self-pay

## 2020-07-22 DIAGNOSIS — R319 Hematuria, unspecified: Secondary | ICD-10-CM | POA: Insufficient documentation

## 2020-07-22 LAB — POC URINALYSIS
BILIRUBIN, URINE: NEGATIVE
GLUCOSE,URINE: NEGATIVE
KETONE, URINE: NEGATIVE
LEUKOCYTE ESTERASE: NEGATIVE
NITRITE, URINE: NEGATIVE
PH URINE: 7 (ref 5.0–8.0)
PROTEIN, URINE: NEGATIVE
SPECIFIC GRAVITY, URINE: 1.02 (ref 1.003–1.030)
UROBILINOGEN URINE: 0.2 (ref 0.2–1.0)

## 2020-07-22 LAB — PROSTATIC SPECIFIC ANTIGEN: PROSTATIC SPECIFIC ANTIGEN: 0.723 ng/mL (ref 0.000–4.000)

## 2020-07-22 LAB — CREATININE: CREATININE: 1.5 mg/dL — ABNORMAL HIGH (ref 0.7–1.2)

## 2020-07-22 NOTE — Progress Notes (Signed)
CC: Microscopic hematuria    59 yo male pt of Dr Guadlupe Spanish with MH.  Cysto 06/2020: neg    He had [place] and is here for followup.      He does note urinary frequency. q30-58m. These are large volume voids.  Drinks 2-3 bottles water/d.    No gross hematuria.    No flank pain.    Does have bilat low back pain with working.  Notes some lower abd discomfort x 2 months.      REVIEW OF SYSTEMS:  CONSTITUTIONAL: Negative for any fever, weight loss or weight gain.  HEENT: No visual problems. No sore throat.   CARDIOVASCULAR: No chest pain/palpitation.  PULMONARY: Denies any wheeze. No SOB  GASTROINTESTINAL: Denies any nausea, vomiting. No constipation.  GENITOURINARY: Voiding as above in HPI.  NEUROLOGIC: No mental status changes or visual changes. No seizures or paresthesias. No neurologic changes.  ENDOCRINE: NO diabetes. NO thyroid disease.  SKIN: Denies any skin lesions. No rashes.  PSYCHIATRIC: no psychiatric illness.  MUSCULOSKETAL:  Normal gait.       Past Medical History: reviewed, and no sig changes.   Past Family History: reviewed, and no sig changes.  Social History: reviewed, and no sig changes.  Medications: list reviewed, and without any sig interactions/side effects, unless as noted above in HPI.      PHYSICAL EXAMINATION:  GENERAL: The patient is healthy-appearing. Well developed. Well nourished. No acute distress.  SKIN: No rashes. No lesions.  HEENT: Atraumatic, normocephalic. Tongue midline. Conjunctivae and sclerae are clear. Extraocular movements are full. Oral mucosa normal. Pharynx is negative.  NECK: Supple. There is no lymphadenopathy. No masses are noted. Trachea is midline.   LUNGS: Normal respiratory effort. No wheezing.  CARDIOVASCULAR: Regular pulses in upper extremities.   ABDOMEN: No organomegaly. No masses. No tenderness. No CVA tenderness.  NEUROLOGICAL: Alert and oriented. Good gait and balance.        [place]:     Mild fullness of the right renal pelvis. No obvious stones seen. Right ureteral jet was  not visualized.   Further evaluation could include renal stone CT if clinically indicated           59 YO MALE with hematuria. [place] with hydro    Check CTU, Cr, PSA    RTC after above, PVR

## 2020-07-23 ENCOUNTER — Encounter (HOSPITAL_BASED_OUTPATIENT_CLINIC_OR_DEPARTMENT_OTHER): Payer: Self-pay | Admitting: Internal Medicine

## 2020-07-23 DIAGNOSIS — R899 Unspecified abnormal finding in specimens from other organs, systems and tissues: Secondary | ICD-10-CM | POA: Insufficient documentation

## 2020-07-23 DIAGNOSIS — N289 Disorder of kidney and ureter, unspecified: Secondary | ICD-10-CM | POA: Insufficient documentation

## 2020-08-14 ENCOUNTER — Other Ambulatory Visit: Payer: Self-pay

## 2020-08-14 ENCOUNTER — Ambulatory Visit
Admission: RE | Admit: 2020-08-14 | Discharge: 2020-08-14 | Disposition: A | Discharge status: Home / Self Care | Payer: 59 | Attending: Specialist | Admitting: Specialist

## 2020-08-14 DIAGNOSIS — R319 Hematuria, unspecified: Secondary | ICD-10-CM

## 2020-08-14 DIAGNOSIS — N400 Enlarged prostate without lower urinary tract symptoms: Secondary | ICD-10-CM | POA: Insufficient documentation

## 2020-08-14 DIAGNOSIS — N281 Cyst of kidney, acquired: Secondary | ICD-10-CM

## 2020-08-14 MED ORDER — IOHEXOL 350 MG/ML IV SOLN
100.00 mL | Freq: Once | INTRAVENOUS | Status: AC
Start: 2020-08-14 — End: 2020-08-14
  Administered 2020-08-14: 100 mL via INTRAVENOUS

## 2020-08-14 MED ORDER — NORMAL SALINE FLUSH 0.9 % IV SOLN
100.00 mL | Freq: Once | INTRAVENOUS | Status: AC
Start: 2020-08-14 — End: 2020-08-14
  Administered 2020-08-14: 100 mL via INTRAVENOUS

## 2020-08-26 ENCOUNTER — Other Ambulatory Visit: Payer: Self-pay

## 2020-08-26 ENCOUNTER — Ambulatory Visit: Payer: 59 | Attending: Specialist | Admitting: Specialist

## 2020-08-26 DIAGNOSIS — N179 Acute kidney failure, unspecified: Secondary | ICD-10-CM

## 2020-08-26 DIAGNOSIS — R3129 Other microscopic hematuria: Secondary | ICD-10-CM

## 2020-08-26 LAB — POC URINALYSIS
BILIRUBIN, URINE: NEGATIVE
GLUCOSE,URINE: NEGATIVE
KETONE, URINE: NEGATIVE
LEUKOCYTE ESTERASE: NEGATIVE
NITRITE, URINE: NEGATIVE
PH URINE: 5.5 (ref 5.0–8.0)
PROTEIN, URINE: NEGATIVE
SPECIFIC GRAVITY, URINE: 1.02 (ref 1.003–1.030)
UROBILINOGEN URINE: 0.2 (ref 0.2–1.0)

## 2020-08-26 LAB — MEAS POST-VOIDING RESIDUAL URINE&/BLADDER CAP: Post Void Residual: 1 — ABNORMAL LOW (ref 0–0)

## 2020-08-26 NOTE — Progress Notes (Signed)
Addendum    I saw and examined the patient and agree with the assessment and plan of the above note.    59 yo male with MH. Cysto 06/2020 neg.  He had recent CT, neg except for left renal cyst.    PROSTATIC SPECIFIC ANTIGEN (ng/mL)   Date Value   07/22/2020 0.723   11/22/2007 0.6     No results found for: PSATOTAL  No results found for: PERCTFREEPSA    Workup neg to date.    Recheck Cr    Televisit 1 wk    REVIEW OF SYSTEMS:  CONSTITUTIONAL: Negative for any fever, weight loss or weight gain.  HEENT: No visual problems. No sore throat.   CARDIOVASCULAR: No chest pain/palpitation.  PULMONARY: Denies any wheeze. No SOB  GASTROINTESTINAL: Denies any nausea, vomiting. No constipation.  GENITOURINARY: Voiding as above in HPI.  NEUROLOGIC: No mental status changes or visual changes. No seizures or paresthesias. No neurologic changes.  ENDOCRINE: no diabetes. no thyroid disease.  SKIN: Denies any skin lesions. No rashes.  PSYCHIATRIC: no psychiatric illness.  MUSCULOSKETAL:  Normal gait.       Past Medical History: reviewed, and no sig changes.   Past Family History: reviewed, and no sig changes.  Social History: reviewed, and no sig changes.  Medications: list reviewed, and without any sig interactions/side effects, unless as noted above in HPI.      PHYSICAL EXAMINATION:  GENERAL: The patient is healthy-appearing. Well developed. Well nourished. No acute distress.  SKIN: No rashes. No lesions.  HEENT: Atraumatic, normocephalic. Tongue midline. Conjunctivae and sclerae are clear. Extraocular movements are full. Oral mucosa normal. Pharynx is negative.  NECK: Supple. There is no lymphadenopathy. No masses are noted. Trachea is midline.   LUNGS: Normal respiratory effort. No wheezing.  CARDIOVASCULAR: Regular pulses in upper extremities.   ABDOMEN: No organomegaly. No masses. No tenderness. No CVA tenderness.  NEUROLOGICAL: Alert and oriented. Good gait and balance.            Orie Fisherman MD

## 2020-08-26 NOTE — Progress Notes (Signed)
CC: Microscopic hematuria    59 yo male pt of Dr Guadlupe Spanish with MH.  Cysto 06/2020: neg  He had [place] w/ mild fullness of the right renal pelvis.    Continues to note freq q30-62m.   Otherwise not sig LUTS.   No sense incomplete emptying.   No incontinence.   No issues with vasalva void.     No pain in the urethra, testicles.     No gross hematuria.  No low abd pain at this time.   No flank pain.    Does have bilat low back pain with working.  Sometimes pain in both sides in the low back.   Does not note that it was worse on left side vs right.     No fam h/o GU cancer - prostate cancer    REVIEW OF SYSTEMS:  CONSTITUTIONAL: Negative for any fever, weight loss or weight gain.  HEENT: No visual problems. No sore throat.   CARDIOVASCULAR: No chest pain/palpitation.  PULMONARY: Denies any wheeze. No SOB  GASTROINTESTINAL: Denies any nausea, vomiting. No constipation.  GENITOURINARY: Voiding as above in HPI.  NEUROLOGIC: No mental status changes or visual changes. No seizures or paresthesias. No neurologic changes.  ENDOCRINE: NO diabetes. NO thyroid disease.  SKIN: Denies any skin lesions. No rashes.  PSYCHIATRIC: no psychiatric illness.  MUSCULOSKETAL:  Normal gait.       Past Medical History: reviewed, and no sig changes.   Past Family History: reviewed, and no sig changes.  Social History: reviewed, and no sig changes.  Medications: list reviewed, and without any sig interactions/side effects, unless as noted above in HPI.      PHYSICAL EXAMINATION:  GENERAL: The patient is healthy-appearing. Well developed. Well nourished. No acute distress.  SKIN: No rashes. No lesions.  HEENT: Atraumatic, normocephalic. Tongue midline. Conjunctivae and sclerae are clear. Extraocular movements are full. Oral mucosa normal. Pharynx is negative.  NECK: Supple. There is no lymphadenopathy. No masses are noted. Trachea is midline.   LUNGS: Normal respiratory effort. No wheezing.  CARDIOVASCULAR: Regular pulses in upper extremities.    ABDOMEN: No organomegaly. No masses. No tenderness. No CVA tenderness.  NEUROLOGICAL: Alert and oriented. Good gait and balance.    CT Abdomen & Pelvis W WO IV Contrast  CLINICAL INDICATION: Hematuria, unknown cause    COMPARISON: Renal ultrasound 07/08/2020, lumbar spine radiographs 05/29/2018    TECHNIQUE: Noncontrast and contrast enhanced CT of the abdomen and pelvis in corticomedullary and excretory phases with multiplanar reformatted images were performed.  IV Contrast: Omnipaque 350  Radiation dose: Radiation dose reduction techniques were employed. CTDIvol: 3.8 - 6.2 mGy. DLP: 442 mGy-cm.    FINDINGS:    Lower thorax: Unremarkable.    Liver: Unremarkable.     Gallbladder: No radiodense stones. No pericholecystic fluid.     Biliary: No biliary ductal dilatation.     Pancreas: Unremarkable.    Spleen: Unremarkable with an accessory splenule in the left upper quadrant.    Adrenals: Unremarkable.    Kidneys: There is no renal or ureteral calculus, and there is no hydronephrosis. A 1.1 x 1.1 x 1.1 cm low-density lesion in the mid to upper pole of the left kidney demonstrates no significant enhancement, compatible with the cyst seen on ultrasound.    The kidneys otherwise enhance normally. Excreted contrast opacifies the bilateral renal collecting systems and ureters with no suspicious filling defect. Portions of the ureters are not opacified and are suboptimally evaluated.    Stomach: Unremarkable.  Bowel: Unremarkable.     Appendix: Unremarkable.    Peritoneal cavity: No ascites or fluid collection.  No free air.     Bladder: No bladder calculus. Excreted contrast layers dependently in the bladder with no suspicious filling defect. There is apparent circumferential bladder wall thickening, but the bladder is underdistended.    Reproductive organs: Mildly enlarged prostate.    Vessels: Mild aortoiliac atherosclerosis. No abdominal aortic aneurysm. Patent main portal vein.    Lymph nodes: No  lymphadenopathy.    Abdominal wall: Unremarkable.    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 Subcentimeter sclerotic foci in the left ilium and left proximal femur are most likely bone islands.    IMPRESSION:     1. No definite CT explanation for the patient's hematuria. No renal calculus or renal mass.    2. Unchanged left renal cyst.    3. Apparent circumferential bladder wall thickening is exaggerated by under distention. Correlate with urinalysis to exclude cystitis, and consider cystoscopy.      4. Mildly enlarged prostate.          Reviewed and Electronically Signed by: Judd Lien MD   Signed Date/Time: 08-14-2020 16:06:30           CREATININE   Date Value   07/22/2020 1.5 mg/dL (H)   99/37/1696 1.1 mg/dl   78/93/8101 1.1 mg/dl     PROSTATIC SPECIFIC ANTIGEN (ng/mL)   Date Value   07/22/2020 0.723   11/22/2007 0.6     No results found for: PSATOTAL      PVR 1cc     59 YO MALE with hematuria, recent bump in SCR.    [place] with hydro  CT scan without explanation for patients hematuria. No stones.   Mildly enlarged prostate.     BPH   Empties well.   +freq otherwise no concerns.     Hematuria  Still with moderate blood.   No source of hematuria determined.     -  I recommend consideration for evaluation by Nephrology.  He defers.     Elevated creatinine  No h/o HTN, DM  Re-chk Scr.   DDX: transient dehydration as source of hematuria   If continued elevation, plan for nephro referral.     Erie Noe, PA-C

## 2020-08-26 NOTE — Patient Instructions (Signed)
Patient Education     Hematuria, Adult  Hematuria is blood in the urine. Blood may be visible in the urine, or it may be identified with a test. This condition can be caused by infections of the bladder, urethra, kidney, or prostate. Other possible causes include:   Kidney stones.   Cancer of the urinary tract.   Too much calcium in the urine.   Conditions that are passed from parent to child (inherited conditions).   Exercise that requires a lot of energy.  Infections can usually be treated with medicine, and a kidney stone usually will pass through your urine. If neither of these is the cause of your hematuria, more tests may be needed to identify the cause of your symptoms.  It is very important to tell your health care provider about any blood in your urine, even if it is painless or the blood stops without treatment. Blood in the urine, when it happens and then stops and then happens again, can be a symptom of a very serious condition, including cancer. There is no pain in the initial stages of many urinary cancers.  Follow these instructions at home:  Medicines   Take over-the-counter and prescription medicines only as told by your health care provider.   If you were prescribed an antibiotic medicine, take it as told by your health care provider. Do not stop taking the antibiotic even if you start to feel better.  Eating and drinking   Drink enough fluid to keep your urine clear or pale yellow. It is recommended that you drink 3-4 quarts (2.8-3.8 L) a day. If you have been diagnosed with an infection, it is recommended that you drink cranberry juice in addition to large amounts of water.   Avoid caffeine, tea, and carbonated beverages. These tend to irritate the bladder.   Avoid alcohol because it may irritate the prostate (men).  General instructions   If you have been diagnosed with a kidney stone, follow your health care provider's instructions about straining your urine to catch the  stone.   Empty your bladder often. Avoid holding urine for long periods of time.   If you are male:  ? After a bowel movement, wipe from front to back and use each piece of toilet paper only once.  ? Empty your bladder before and after sex.   Pay attention to any changes in your symptoms. Tell your health care provider about any changes or any new symptoms.   It is your responsibility to get your test results. Ask your health care provider, or the department performing the test, when your results will be ready.   Keep all follow-up visits as told by your health care provider. This is important.  Contact a health care provider if:   You develop back pain.   You have a fever.   You have nausea or vomiting.   Your symptoms do not improve after 3 days.   Your symptoms get worse.  Get help right away if:   You develop severe vomiting and are unable take medicine without vomiting.   You develop severe pain in your back or abdomen even though you are taking medicine.   You pass a large amount of blood in your urine.   You pass blood clots in your urine.   You feel very weak or like you might faint.   You faint.  Summary   Hematuria is blood in the urine. It has many possible causes.   It   is very important that you tell your health care provider about any blood in your urine, even if it is painless or the blood stops without treatment.   Take over-the-counter and prescription medicines only as told by your health care provider.   Drink enough fluid to keep your urine clear or pale yellow.  This information is not intended to replace advice given to you by your health care provider. Make sure you discuss any questions you have with your health care provider.  Document Revised: 03/14/2019 Document Reviewed: 11/20/2016  Elsevier Patient Education  2021 Elsevier Inc.

## 2020-09-08 ENCOUNTER — Other Ambulatory Visit: Payer: Self-pay

## 2020-09-08 ENCOUNTER — Ambulatory Visit: Payer: 59 | Attending: Internal Medicine

## 2020-09-08 DIAGNOSIS — R3129 Other microscopic hematuria: Secondary | ICD-10-CM | POA: Insufficient documentation

## 2020-09-08 DIAGNOSIS — Z125 Encounter for screening for malignant neoplasm of prostate: Secondary | ICD-10-CM | POA: Diagnosis present

## 2020-09-08 LAB — PROSTATIC SPECIFIC ANTIGEN: PROSTATIC SPECIFIC ANTIGEN: 0.682 ng/mL (ref 0.000–4.000)

## 2020-09-08 LAB — CREATININE: CREATININE: 1.6 mg/dL — ABNORMAL HIGH (ref 0.7–1.2)

## 2020-09-08 NOTE — Progress Notes (Signed)
Labs drawn today no complications, Craig Peterson, [place], 09/08/2020

## 2020-09-10 ENCOUNTER — Encounter (HOSPITAL_BASED_OUTPATIENT_CLINIC_OR_DEPARTMENT_OTHER): Payer: Self-pay | Admitting: Internal Medicine

## 2020-09-10 ENCOUNTER — Telehealth (HOSPITAL_BASED_OUTPATIENT_CLINIC_OR_DEPARTMENT_OTHER): Payer: Self-pay

## 2020-09-10 NOTE — Telephone Encounter (Signed)
Called patient to schedule a televisit at [facility] with Greer Ee for  Thursday 11/11. Unable to reach patient, left VM asking for call back.

## 2020-10-10 ENCOUNTER — Ambulatory Visit: Payer: 59 | Attending: Internal Medicine | Admitting: Internal Medicine

## 2020-10-10 DIAGNOSIS — Z0000 Encounter for general adult medical examination without abnormal findings: Secondary | ICD-10-CM | POA: Insufficient documentation

## 2020-10-10 DIAGNOSIS — R3129 Other microscopic hematuria: Secondary | ICD-10-CM | POA: Diagnosis not present

## 2020-10-10 DIAGNOSIS — N289 Disorder of kidney and ureter, unspecified: Secondary | ICD-10-CM | POA: Diagnosis not present

## 2020-10-10 NOTE — Procedures (Deleted)
Dutch Quint, MD - 06/18/2019  Formatting of this note might be different from the original.  CT ANGIOGRAM OF THE CHEST     INDICATION: Chest trauma, penetrating.     COMPARISON: None.     TECHNIQUE: The study was performed during the dynamic administration  of intravenous contrast for a CT angiogram with 100 mL of Isovue-370  intravenous contrast. Multiplanar Reconstruction (MPR) imaging and  3-D reformatted imaging, including [place] ximum Intensity Projection (MIP)  imaging, were created by the CT technologist at an independent  workstation and sent to PACS for interpretation under the supervision  of the undersigned radiologist. ..Lung computer aided detection (CAD)  was utilized. Automated exposure control, adjustment of the [place] and/or  kV according to patient size, and use of iterative reconstruction  technique was utilized.      FINDINGS:   NON-VASCULAR FINDINGS :   Lower neck: Normal.     Mediastinum and hila: Normal.     Lymph nodes: There is no lymphadenopathy.     Lungs: The lungs are clear without pulmonary nodules or masses. The  central airways are patent.     Pleura: There is no pneumothorax or pleural effusion.     Chest wall: Laceration of the upper left chest wall with wound  passing through the pectoralis major. Subcutaneous air related to the  laceration extends between the tissue  planes and up to the level of  the subclavian vasculature. No evidence of hemorrhage..     Osseous structures: There are no destructive osseous lesions.    Upper abdomen: Normal.       VASCULAR FINDINGS:   There is excellent opacification of the pulmonary arterial system.      Heart and great vessels: The heart is normal in size without a  pericardial effusion. The thoracic aorta is not enlarged.     Pulmonary arteries: There is no evid ence for a pulmonary embolus. The  pulmonary arteries are normal in size.     Other vasculature: No significant abnormality.       IMPRESSION:    1. Laceration of the upper left chest  wall with wound passing through  the pectoralis major. Subcutaneous air related to the laceration  extends between the tissue planes and up to the level of the  subclavian vasculature. No evidence of hemorrhage.. No rib fracture..    2. There is no other s ignificant abnormality.     Electronically signed by: Dutch Quint 06/18/2019 12:00 PM

## 2020-10-10 NOTE — Progress Notes (Signed)
Cc: follow up kidney issue    Denies any hematuria  The patient denies urinary hesitancy, nocturia, frequency or incomplete voiding.     Prior hx   5/21 ED visit: 59 year old male patient with urinary frequency, urgency, and suprapubic discomfort. No difficult or incomplete urinating. Will therefore treat for likely acute UTI, and refer to urology for further workup of hematuria.   9/21 renal u/s: Mild fullness of the right renal pelvis. No obvious stones seen. Right ureteral jet was not visualized.   10/21 CT abd: 1. No definite CT explanation for the patient's hematuria. No renal calculus or renal mass. 2. Unchanged left renal cyst. 3. Apparent circumferential bladder wall thickening is exaggerated by under distention. Correlate with urinalysis to exclude cystitis, and consider cystoscopy.     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 as televisit    ASSESSMENT & PLAN:  (N28.9) Renal insufficiency  (primary encounter diagnosis)  (R31.29) Microscopic  hematuria  Comment: he is asymptomatic - the problem is his increasing Cr combined with his microscopic hematuria - now needs to see nephrology for follow up   This is further complicated by the fact that he's planning to return to [place] in March for several years  We discussed that whether or not we can get him into nephrology before that - the key is going to be follow up with nephrology in [place] as this will not be something that can be sorted out at one visit - he agrees & will follow up with nephrologist in [place] as well, we'll try to get things rolling by arranging a consult here before he leaves  After reviewing all records - as he's had normal CT abd & normal cystoscopy, I do not believe he needs any more follow up with urology - so told him i'd contact urology to double check that & then cancel his two upcoming apts as nephrology is the key   He understands &  agrees with the plan  If he can not see nephrology in next few weeks -then will check the following to get a sense of progress & advice him before he leaves if process appears to be worsening: URINALYSIS, MICROALBUMIN RANDOM URINE, REFERRAL        TO NEPHROLOGY ( INT), BASIC METABOLIC PANEL          The patient was ready to learn and no apparent learning or adherence barriers were identified. I explained the diagnosis and treatment plan, and the patient expressed understanding of the content. I attempted to answer any questions regarding the diagnosis and the proposed treatment.       follow-up will be scheduled with neprhologist   he has been advised to call or return with any worsening or new problems    30 minutes was spent on the day of the visit on some or all of the following activities:   Preparing to see the patient (eg, review of tests)   Obtaining and/or reviewing separately obtained history   Performing a medically appropriate examination and/or evaluation   Counseling and educating the patient/family/caregiver   Ordering  medications, tests, or procedures   Referring and communicating with other health care professionals    Documenting clinical information in the electronic or other health record   Independently interpreting results and  communicating results to the patient/family/caregiver   Care coordination

## 2020-10-10 NOTE — Procedures (Deleted)
Callie Fielding, DO - 06/18/2019  Formatting of this note might be different from the original.  CHEST SERIES, PORTABLE AP VIEW    INDICATION: chest lac.     COMPARISON: Available prior studies.     FINDINGS:   The lungs are well expanded and clear. No pneumothorax. Normal  cardiothymic mediastinal silhouette. There is a metallic density  projecting over the lateral aspect of the first rib on the left.    IMPRESSION:    1. No acute cardio pulmonary abnormality.  2. Nonspecific metallic density projecting over the left rib  laterally, possibly a foreign body. Recommend correlation with  physical exam looking for foreign body or abnormality in this region.  Additional localization can be performed with repeat chest  radiography, ultrasound, or CT.    Electronically signed by: Cherre Robins, DO 06/18/2019 11:37 AM

## 2020-10-14 ENCOUNTER — Ambulatory Visit (HOSPITAL_BASED_OUTPATIENT_CLINIC_OR_DEPARTMENT_OTHER): Payer: 59 | Admitting: Specialist

## 2020-11-12 ENCOUNTER — Telehealth (HOSPITAL_BASED_OUTPATIENT_CLINIC_OR_DEPARTMENT_OTHER): Payer: Self-pay

## 2020-11-12 NOTE — Telephone Encounter (Signed)
Telephone call to pt. Left voicemail message for pt. To call to schedule appt with Nephrology and to cancel urology - per PCP. Also advised pt that if he goes to [place] before his appt. He will have to follow up with nephrologist there.

## 2020-11-12 NOTE — Telephone Encounter (Signed)
-----   Message from Greer Ee, MD sent at 11/03/2020 12:49 PM EST -----  Legrand Como, this pt needs to see nephrologist - but I don't see an apt - he can cancel his April apt w/ urology as just needs nephrology - not urology.  But he also told me he might be permanently leaving in March for [place]  If that remains correct & he can't get a nephrology apt before he leaves, then please review with him that he must follow up with nephrologist soon after returning to [place].  Let me know what the status is  Thanks, [employer]

## 2020-11-20 ENCOUNTER — Telehealth (HOSPITAL_BASED_OUTPATIENT_CLINIC_OR_DEPARTMENT_OTHER): Payer: Self-pay

## 2020-11-20 NOTE — Telephone Encounter (Signed)
2nd outreach call to pt. Pt. reached. Gave message from PCP. Pt. Understood. Pt. Is going to [place] in March. Pt. [place] Spaniel he will be there for at least 6 months and possibly more. Pt. Agreed to see Nephrologist in [place] while he is there. Urology appt. In April is cancelled per pt's requested. Pt. Agreed to call us back when he returns from [place].

## 2020-11-20 NOTE — Telephone Encounter (Signed)
-----   Message from Lorenza Chick, [place] sent at 11/12/2020  2:42 PM EST -----    ----- Message -----  From: Greer Ee, MD  Sent: 11/03/2020  12:49 PM EST  To: Lorenza Chick, [place]    Quantisha Marsicano, this pt needs to see nephrologist - but I don't see an apt - he can cancel his April apt w/ urology as just needs nephrology - not urology.  But he also told me he might be permanently leaving in March for [place]  If that remains correct & he can't get a nephrology apt before he leaves, then please review with him that he must follow up with nephrologist soon after returning to [place].  Let me know what the status is  Thanks, [employer]

## 2021-02-17 ENCOUNTER — Other Ambulatory Visit (HOSPITAL_BASED_OUTPATIENT_CLINIC_OR_DEPARTMENT_OTHER): Payer: Self-pay

## 2021-02-24 ENCOUNTER — Ambulatory Visit (HOSPITAL_BASED_OUTPATIENT_CLINIC_OR_DEPARTMENT_OTHER): Payer: Self-pay | Admitting: Specialist

## 2021-06-06 ENCOUNTER — Telehealth (HOSPITAL_BASED_OUTPATIENT_CLINIC_OR_DEPARTMENT_OTHER): Payer: Self-pay | Admitting: Geriatrics

## 2021-06-06 NOTE — Telephone Encounter (Signed)
-----   Message from Greer Ee, MD sent at 06/03/2021  5:37 PM EDT -----  Hi Team,     Please call to let him know there is a blood test that I have ordered for him and he can get done as lab only visit & now it is important to have this completed.  Please help him schedule a lab visit.    Let me know if any issues, thanks, Haydee Monica

## 2021-06-06 NOTE — Telephone Encounter (Signed)
Unable to reach patient lVM to call back to schedule appointment.

## 2023-06-13 IMAGING — MR RM PE DIR
6 of 8 series · 28 of 40 positions shown · non-contrast
Comparison: none

[Series 1: localizador · axial · 6.0mm · 1.02mm/px · z∈[-16,+130]mm · 4 of 15 slices shown]
[im 1/15]
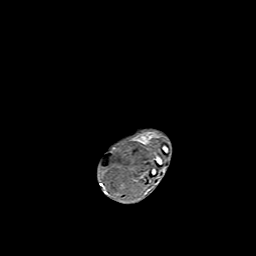
[im 5/15]
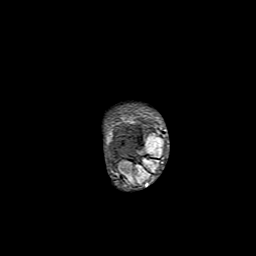
[im 10/15]
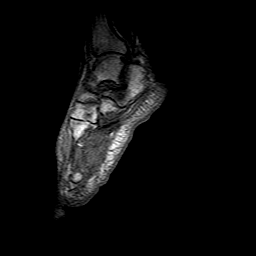
[im 15/15]
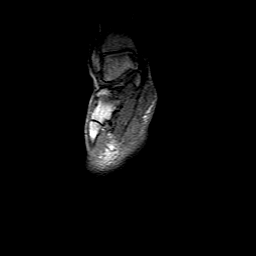

[Series 2: STIR · sagittal · 4.2mm · 1.02mm/px · 3 of 14 slices shown (1 of 2)]
[im 1/14]
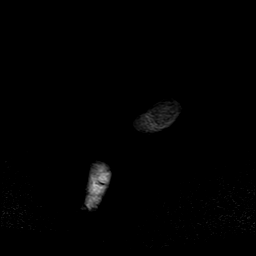
[im 7/14]
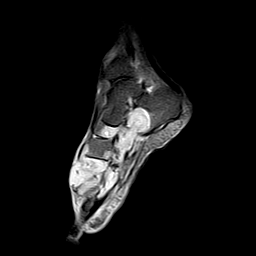
[im 14/14]
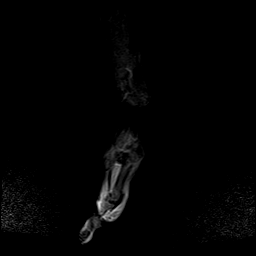

[Series 3: T1 · sagittal · 4.0mm · 0.47mm/px · 4 of 12 slices shown (1 of 3)]
[im 1/12]
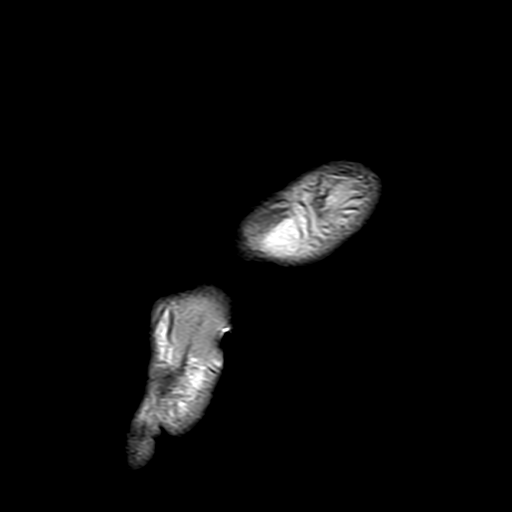
[im 4/12]
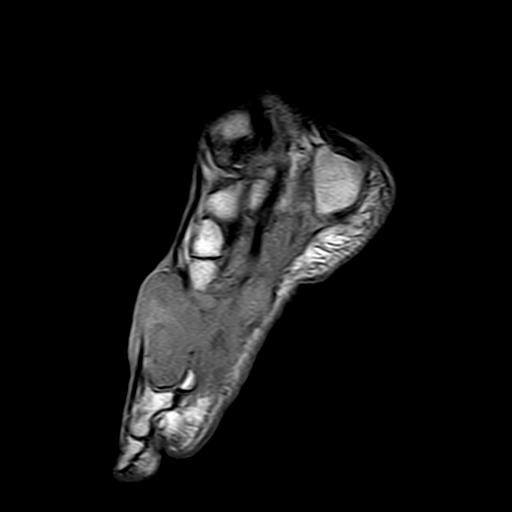
[im 8/12]
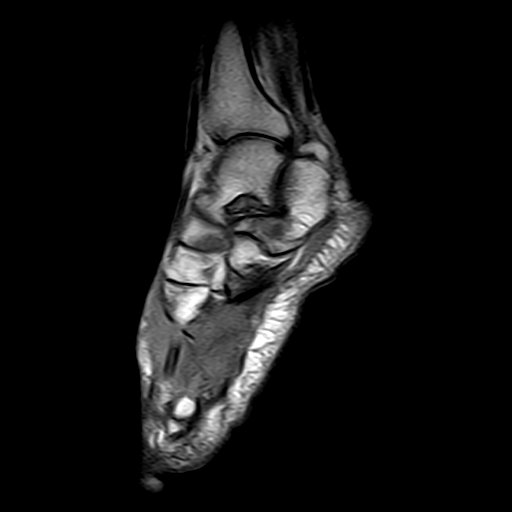
[im 12/12]
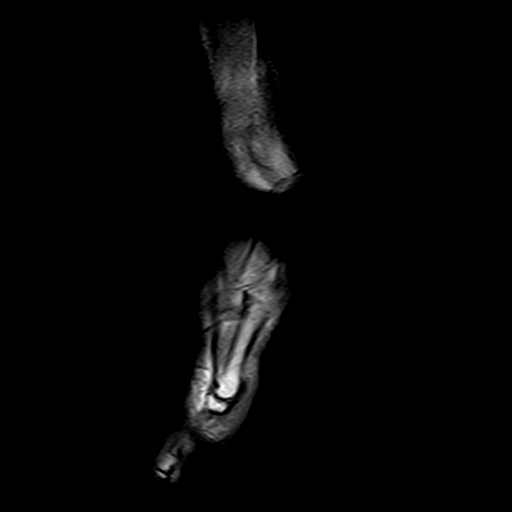

[Series 4: STIR · axial · 4.0mm · 0.78mm/px · z∈[-103,-28]mm · 5 of 22 slices shown (2 of 2)]
[im 1/22]
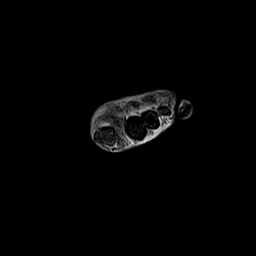
[im 4/22]
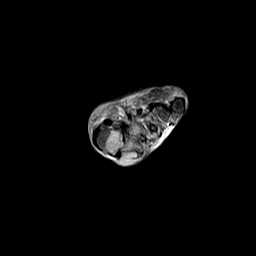
[im 8/22]
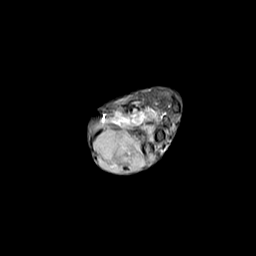
[im 11/22]
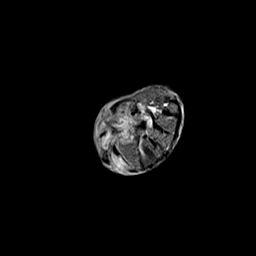
[im 15/22]
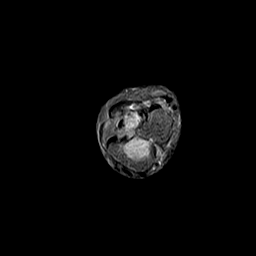

[Series 5: T1 · axial · 4.0mm · 0.39mm/px · z∈[-101,+14]mm · 7 of 22 slices shown (2 of 3)]
[im 1/22]
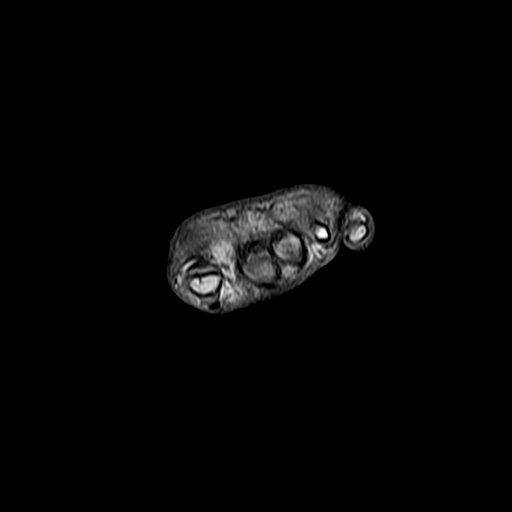
[im 4/22]
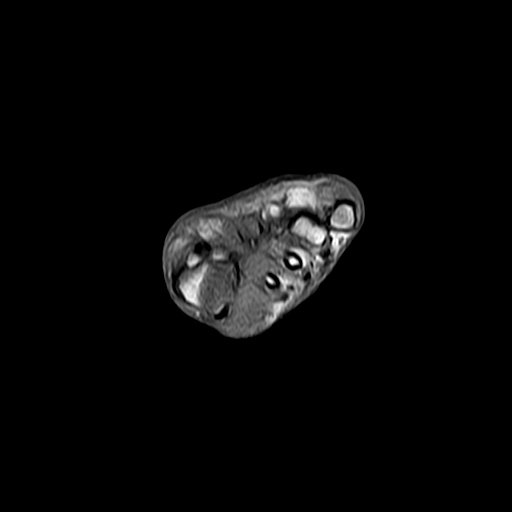
[im 8/22]
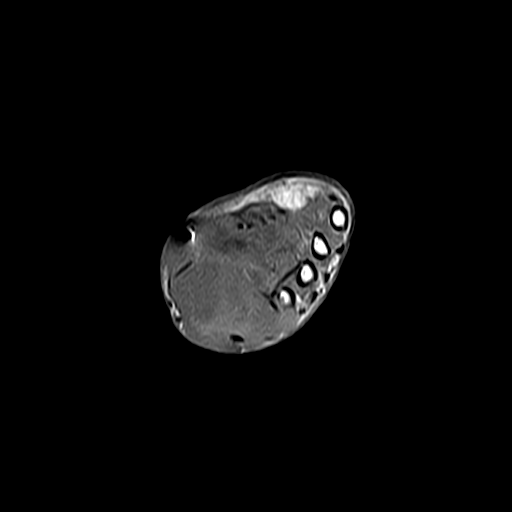
[im 11/22]
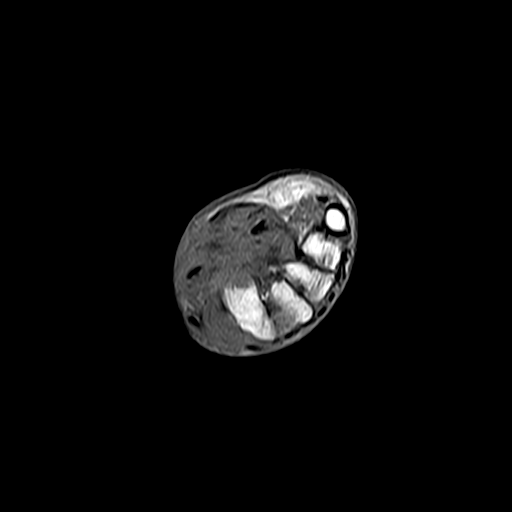
[im 15/22]
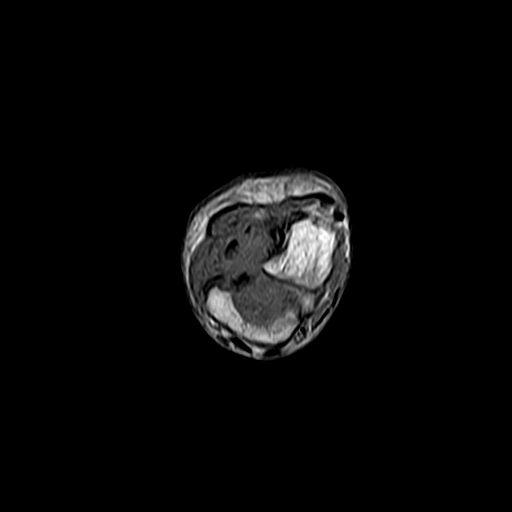
[im 18/22]
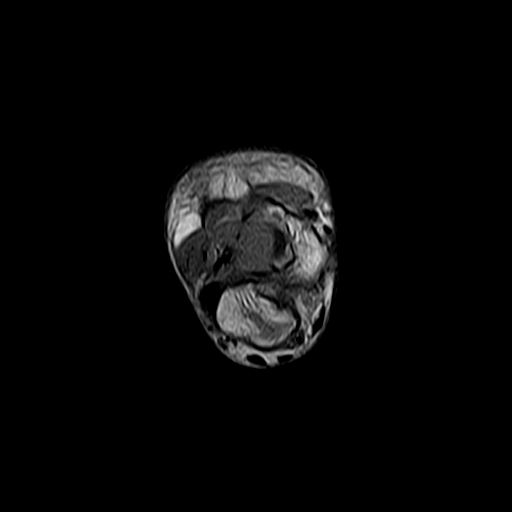
[im 22/22]
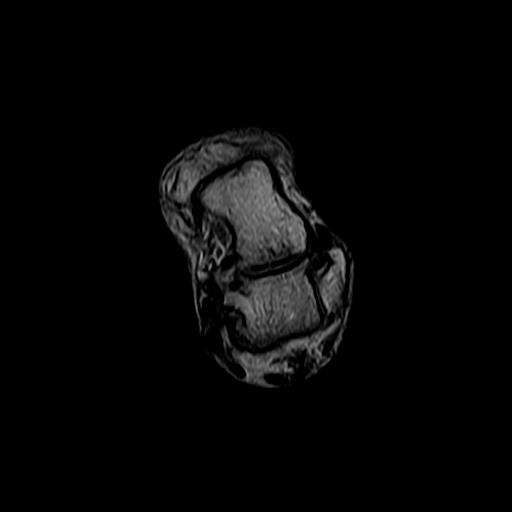

[Series 12: T1 · axial · 3.0mm · 0.53mm/px · z∈[-87,-29]mm · 5 of 17 slices shown (3 of 3)]
[im 1/17]
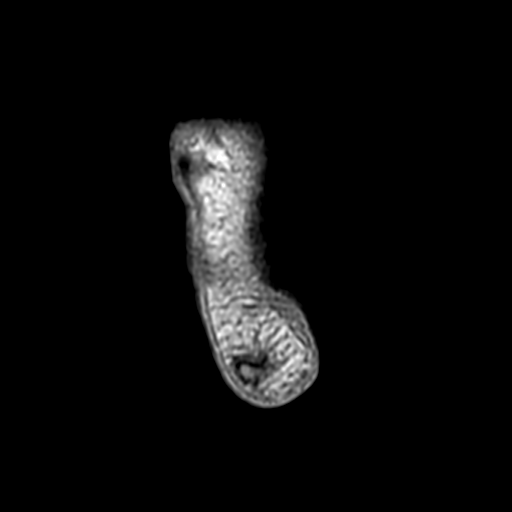
[im 5/17]
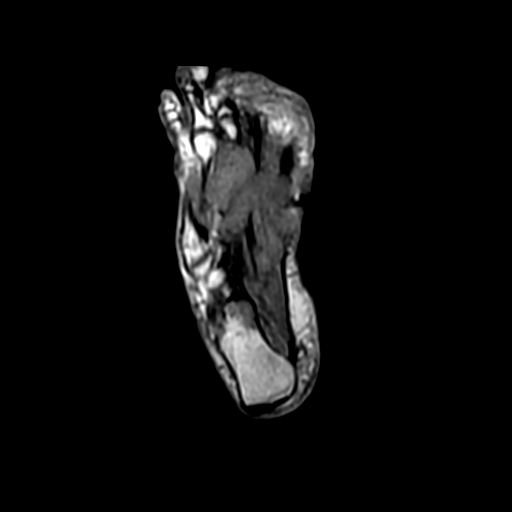
[im 9/17]
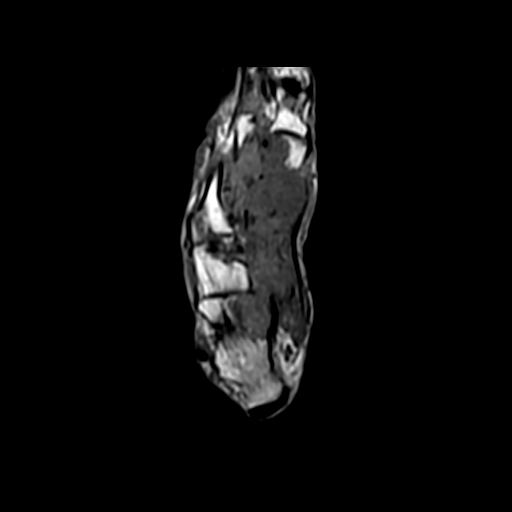
[im 13/17]
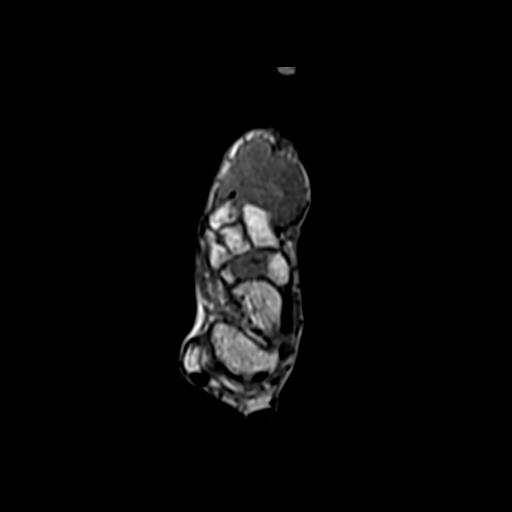
[im 17/17]
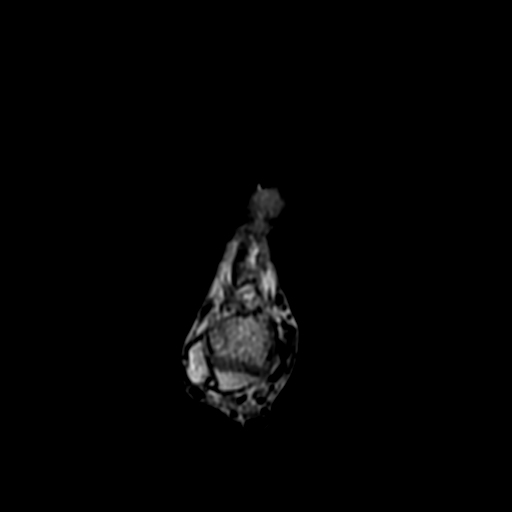

[28 of 40 positions shown; findings below may reference images not displayed]

Método: Exame realizado com a sequência FSE com cortes multiplanares direcionados para a avaliação do
antepé.
Análise:
Lesão expansiva centrada nos 1º e 2º metatarsos, infiltrando o cuneiforme medial, o navicular e o calcâneo,
determinando erosões ósseas e envolvendo também planos miotendíneos profundos na face plantar do
mediopé, bem como os tendões extensores dos 1º e 2º dedos. A lesão apresenta contornos lobulados
e limites bem definidos, medindo 10,6 x 5,5 x 6,0 cm.
RESSONÂNCIA MAGNÉTICA DO PÉ DIREITO
Demais estruturas ósseas íntegras.
Ausência de derrame articular.
Demais tendões com morfologia e intensidade de sinal normais.
Placas plantares sem lesões bem definidas.
Demais grupamentos musculares preservados.
Fáscia plantar com espessura e intensidade de sinal normais.
Conclusão:
Lesão expansiva de aspecto neoplásico centrada nos 1º e 2º metatarsos, infiltrando os ossos do tarso e a
musculatura da face plantar do mediopé, de etiologia a esclarecer. Sugere-se prosseguir a investigação.
